# Patient Record
Sex: Male | Born: 1985 | Race: White | Hispanic: No | Smoking: Former smoker
Health system: Southern US, Community
[De-identification: ages and names within clinical notes are randomized; demographics above are authoritative.]

## PROBLEM LIST (undated history)

## (undated) DIAGNOSIS — Z978 Presence of other specified devices: Secondary | ICD-10-CM

## (undated) DIAGNOSIS — K219 Gastro-esophageal reflux disease without esophagitis: Secondary | ICD-10-CM

## (undated) DIAGNOSIS — G8250 Quadriplegia, unspecified: Secondary | ICD-10-CM

## (undated) HISTORY — PX: SUPRAPUBIC CATHETER PLACEMENT: SHX2473

## (undated) HISTORY — PX: OTHER SURGICAL HISTORY: SHX169

---

## 2021-03-18 ENCOUNTER — Other Ambulatory Visit
Admission: RE | Admit: 2021-03-18 | Discharge: 2021-03-18 | Disposition: A | Discharge status: Home / Self Care | Payer: Medicare Other | Source: Ambulatory Visit | Attending: Physician Assistant | Admitting: Physician Assistant

## 2021-03-18 ENCOUNTER — Encounter: Payer: Medicaid Other | Attending: Physician Assistant | Admitting: Physician Assistant

## 2021-03-18 ENCOUNTER — Other Ambulatory Visit: Payer: Self-pay

## 2021-03-18 DIAGNOSIS — L03312 Cellulitis of back [any part except buttock]: Secondary | ICD-10-CM | POA: Diagnosis present

## 2021-03-18 DIAGNOSIS — B999 Unspecified infectious disease: Secondary | ICD-10-CM | POA: Insufficient documentation

## 2021-03-18 NOTE — Progress Notes (Signed)
LEXX, MONTE ([id number]) Visit Report for 03/18/2021 Allergy List Details Patient Name: [name], [name] Date of Service: 03/18/2021 8:45 AM Medical Record Number: [id number] Patient Account Number: [id number] Date of Birth/Sex: Nov 16, 1985 (35 y.o. M) Treating RN: Yevonne Pax Primary Care Athel Merriweather: Unknown Foley Other Clinician: Referring Vannak Montenegro: Unknown Foley Treating Xandria Gallaga/Extender: Allen Derry Weeks in Treatment: 0 Allergies Active Allergies No Known Allergies Allergy Notes Electronic Signature(s) Signed: 03/18/2021 12:20:53 PM By: Yevonne Pax RN Entered By: Yevonne Pax on 03/18/2021 08:59:29 [name] ([id number]) -------------------------------------------------------------------------------- Arrival Information Details Patient Name: [name] Date of Service: 03/18/2021 8:45 AM Medical Record Number: [id number] Patient Account Number: [id number] Date of Birth/Sex: [birth year] (35 y.o. M) Treating RN: Yevonne Pax Primary Care Rye Decoste: Unknown Foley Other Clinician: Referring Kamaal Cast: Unknown Foley Treating Westly Hinnant/Extender: Rowan Blase in Treatment: 0 Visit Information Patient Arrived: Wheel Chair Arrival Time: 08:53 Accompanied By: brother Transfer Assistance: None Patient Identification Verified: Yes Secondary Verification Process Completed: Yes Patient Requires Transmission-Based Precautions: No Patient Has Alerts: No Electronic Signature(s) Signed: 03/18/2021 12:20:53 PM By: Yevonne Pax RN Entered By: Yevonne Pax on 03/18/2021 08:54:12 [name] ([id number]) -------------------------------------------------------------------------------- Clinic Level of Care Assessment Details Patient Name: [name] Date of Service: 03/18/2021 8:45 AM Medical Record Number: [id number] Patient Account Number: [id number] Date of Birth/Sex: June 04, 1985 (35 y.o. M) Treating RN: Yevonne Pax Primary Care Delorus Langwell: Unknown Foley Other  Clinician: Referring Kelis Plasse: Unknown Foley Treating Aanika Defoor/Extender: Rowan Blase in Treatment: 0 Clinic Level of Care Assessment Items TOOL 2 Quantity Score X - Use when only an EandM is performed on the INITIAL visit 1 0 ASSESSMENTS - Nursing Assessment / Reassessment X - General Physical Exam (combine w/ comprehensive assessment (listed just below) when performed on new 1 20 pt. evals) X- 1 25 Comprehensive Assessment (HX, ROS, Risk Assessments, Wounds Hx, etc.) ASSESSMENTS - Wound and Skin Assessment / Reassessment []  - Simple Wound Assessment / Reassessment - one wound 0 X- 2 5 Complex Wound Assessment / Reassessment - multiple wounds []  - 0 Dermatologic / Skin Assessment (not related to wound area) ASSESSMENTS - Ostomy and/or Continence Assessment and Care []  - Incontinence Assessment and Management 0 []  - 0 Ostomy Care Assessment and Management (repouching, etc.) PROCESS - Coordination of Care X - Simple Patient / Family Education for ongoing care 1 15 []  - 0 Complex (extensive) Patient / Family Education for ongoing care []  - 0 Staff obtains , Records, Test Results / Process Orders []  - 0 Staff telephones HHA, Nursing Homes / Clarify orders / etc []  - 0 Routine Transfer to another Facility (non-emergent condition) []  - 0 Routine Hospital Admission (non-emergent condition) X- 1 15 New Admissions / / Ordering NPWT, Apligraf, etc. []  - 0 Emergency Hospital Admission (emergent condition) X- 1 10 Simple Discharge Coordination []  - 0 Complex (extensive) Discharge Coordination PROCESS - Special Needs []  - Pediatric / Minor Patient Management 0 []  - 0 Isolation Patient Management []  - 0 Hearing / Language / Visual special needs []  - 0 Assessment of Community assistance (transportation, D/C planning, etc.) []  - 0 Additional assistance / Altered mentation []  - 0 Support Surface(s) Assessment (bed, cushion, seat,  etc.) INTERVENTIONS - Wound Cleansing / Measurement X - Wound Imaging (photographs - any number of wounds) 1 5 []  - 0 Wound Tracing (instead of photographs) []  - 0 Simple Wound Measurement - one wound X- 2 5 Complex Wound Measurement - multiple wounds Ibrahim, Renn ( ) []  - 0 Simple Wound Cleansing - one wound X- 2 5  Complex Wound Cleansing - multiple wounds INTERVENTIONS - Wound Dressings []  - Small Wound Dressing one or multiple wounds 0 []  - 0 Medium Wound Dressing one or multiple wounds X- 2 20 Large Wound Dressing one or multiple wounds []  - 0 Application of Medications - injection INTERVENTIONS - Miscellaneous []  - External ear exam 0 []  - 0 Specimen Collection (cultures, biopsies, blood, body fluids, etc.) []  - 0 Specimen(s) / Culture(s) sent or taken to Lab for analysis []  - 0 Patient Transfer (multiple staff / Lift / Similar devices) []  - 0 Simple Staple / Suture removal (25 or less) []  - 0 Complex Staple / Suture removal (26 or more) []  - 0 Hypo / Hyperglycemic Management (close monitor of Blood Glucose) []  - 0 Ankle / Brachial Index (ABI) - do not check if billed separately Has the patient been seen at the hospital within the last three years: Yes Total Score: 160 Level Of Care: New/Established - Level 5 Electronic Signature(s) Signed: 03/18/2021 12:20:53 PM By: RN Entered By: on 03/18/2021 09:49:37 ( ) -------------------------------------------------------------------------------- Encounter Discharge Information Details Patient Name: [name] Date of Service: 03/18/2021 8:45 AM Medical Record Number: Patient Account Number: Date of Birth/Sex: Jun 25, 1985 (35 y.o. M) Treating RN: Yevonne Pax Primary Care Okey Zelek: Yevonne Pax Other Clinician: Referring Shaylie Eklund: 14/03/2021 Treating Lichelle Viets/Extender: [name] in Treatment: 0 Encounter Discharge  Information Items Discharge Condition: Stable Ambulatory Status: Wheelchair Discharge Destination: Home Transportation: Private Auto Accompanied By: self Schedule Follow-up Appointment: Yes Clinical Summary of Care: Patient Declined Electronic Signature(s) Signed: 03/18/2021 11:09:31 AM By: Robert Spring RN Entered By: 14/03/2021 on 03/18/2021 11:09:30 [id number] (02/24/1986) -------------------------------------------------------------------------------- Lower Extremity Assessment Details Patient Name: [name] Date of Service: 03/18/2021 8:45 AM Medical Record Number: Unknown Foley Patient Account Number: Unknown Foley Date of Birth/Sex: 01-Apr-1986 (35 y.o. M) Treating RN: Yevonne Pax Primary Care Brindley Madarang: Yevonne Pax Other Clinician: Referring Gabrial Poppell: 14/03/2021 Treating Darcie Mellone/Extender: [name] Weeks in Treatment: 0 Electronic Signature(s) Signed: 03/18/2021 9:14:01 AM By: Robert Spring RN Entered By: 14/03/2021 on 03/18/2021 09:14:01 [id number] (02/24/1986) -------------------------------------------------------------------------------- Multi Wound Chart Details Patient Name: [name] Date of Service: 03/18/2021 8:45 AM Medical Record Number: Unknown Foley Patient Account Number: Unknown Foley Date of Birth/Sex: 1986/01/17 (35 y.o. M) Treating RN: Yevonne Pax Primary Care Naliah Eddington: Yevonne Pax Other Clinician: Referring Kirah Stice: 14/03/2021 Treating Quinnley Colasurdo/Extender: [name] Weeks in Treatment: 0 Vital Signs Height(in): 74 Pulse(bpm): 86 Weight(lbs): 180 Blood Pressure(mmHg): 116/57 Body Mass Index(BMI): 23 Temperature(F): 97.81 Respiratory Rate(breaths/min): 18 Photos: Wound Location: Left Back Left Back Right Back Wounding Event: Gradually Appeared Gradually Appeared Gradually Appeared Primary Etiology: Atypical Atypical Atypical Date Acquired: 12/07/2017 12/07/2017 12/06/2017 Weeks of Treatment: 0 0 0 Wound Status: Open Open  Open Clustered Wound: Yes Yes No Measurements L x W x D (cm) 50x17x0.2 50x17x0.2 23x14x0.2 Area (cm) : 0011001100 667.588 252.898 Volume (cm) : 133.518 133.518 50.58 % Reduction in Area: 0.00% 0.00% 0.00% % Reduction in Volume: 0.00% 0.00% 0.00% Classification: Full Thickness Without Exposed Full Thickness Without Exposed Full Thickness Without Exposed Support Structures Support Structures Support Structures Exudate Amount: Medium Medium Medium Exudate Type: Serosanguineous Serosanguineous Serosanguineous Exudate Color: red, brown red, brown red, brown Granulation Amount: Medium (34-66%) Medium (34-66%) Medium (34-66%) Granulation Quality: Red, Pink Red, Pink Red, Pink Necrotic Amount: Medium (34-66%) Medium (34-66%) Medium (34-66%) Exposed Structures: Fat Layer (Subcutaneous Tissue): Fat Layer (Subcutaneous Tissue): Fat Layer (Subcutaneous Tissue): Yes Yes Yes Fascia: No Tendon: No Muscle: No  Joint: No Bone: No Epithelialization: None None Large (67-100%) Treatment Notes Electronic Signature(s) Signed: 03/18/2021 9:47:42 AM By: Yevonne Pax RN Entered By: Yevonne Pax on 03/18/2021 09:47:42 [name] ([id number]) -------------------------------------------------------------------------------- Multi-Disciplinary Care Plan Details Patient Name: [name] Date of Service: 03/18/2021 8:45 AM Medical Record Number: [id number] Patient Account Number: [id number] Date of Birth/Sex: 1985-04-26 (35 y.o. M) Treating RN: Yevonne Pax Primary Care Londell Noll: Unknown Foley Other Clinician: Referring Any Mcneice: Unknown Foley Treating Tyrek Lawhorn/Extender: Allen Derry Weeks in Treatment: 0 Active Inactive Wound/Skin Impairment Nursing Diagnoses: Knowledge deficit related to ulceration/compromised skin integrity Goals: Patient/caregiver will verbalize understanding of skin care regimen Date Initiated: 03/18/2021 Target Resolution Date: 04/18/2021 Goal Status: Active Ulcer/skin  breakdown will have a volume reduction of 30% by week 4 Date Initiated: 03/18/2021 Target Resolution Date: 04/18/2021 Goal Status: Active Ulcer/skin breakdown will have a volume reduction of 50% by week 8 Date Initiated: 03/18/2021 Target Resolution Date: 05/19/2021 Goal Status: Active Ulcer/skin breakdown will have a volume reduction of 80% by week 12 Date Initiated: 03/18/2021 Target Resolution Date: 06/16/2021 Goal Status: Active Ulcer/skin breakdown will heal within 14 weeks Date Initiated: 03/18/2021 Target Resolution Date: 07/17/2021 Goal Status: Active Interventions: Assess patient/caregiver ability to obtain necessary supplies Assess patient/caregiver ability to perform ulcer/skin care regimen upon admission and as needed Assess ulceration(s) every visit Notes: Electronic Signature(s) Signed: 03/18/2021 9:22:56 AM By: Yevonne Pax RN Entered By: Yevonne Pax on 03/18/2021 09:22:56 [name] ([id number]) -------------------------------------------------------------------------------- Pain Assessment Details Patient Name: [name] Date of Service: 03/18/2021 8:45 AM Medical Record Number: [id number] Patient Account Number: [id number] Date of Birth/Sex: 1985-08-28 (35 y.o. M) Treating RN: Yevonne Pax Primary Care  Wenzlick: Unknown Foley Other Clinician: Referring Kristalyn Bergstresser: Unknown Foley Treating Lilac Hoff/Extender: Rowan Blase in Treatment: 0 Active Problems Location of Pain Severity and Description of Pain Patient Has Paino Yes Site Locations With Dressing Change: Yes Duration of the Pain. Constant / Intermittento Intermittent How Long Does it Lasto Hours: Minutes: 15 Rate the pain. Current Pain Level: 3 Worst Pain Level: 10 Least Pain Level: 0 Tolerable Pain Level: 5 Character of Pain Describe the Pain: Burning Pain Management and Medication Current Pain Management: Medication: No Cold Application: No Rest: Yes Massage: No Activity:  No T.E.N.S.: No Heat Application: No Leg drop or elevation: No Is the Current Pain Management Adequate: Inadequate How does your wound impact your activities of daily livingo Sleep: No Bathing: Yes Appetite: Yes Relationship With Others: No Bladder Continence: No Emotions: No Bowel Continence: No Work: No Toileting: No Drive: No Dressing: No Hobbies: No Electronic Signature(s) Signed: 03/18/2021 12:20:53 PM By: Yevonne Pax RN Entered By: Yevonne Pax on 03/18/2021 08:57:51 [name] ([id number]) -------------------------------------------------------------------------------- Patient/Caregiver Education Details Patient Name: [name] Date of Service: 03/18/2021 8:45 AM Medical Record Number: [id number] Patient Account Number: [id number] Date of Birth/Gender: [birth year] (35 y.o. M) Treating RN: Yevonne Pax Primary Care Physician: Unknown Foley Other Clinician: Referring Physician: Unknown Foley Treating Physician/Extender: Rowan Blase in Treatment: 0 Education Assessment Education Provided To: Patient Education Topics Provided Wound/Skin Impairment: Methods: Explain/Verbal Responses: State content correctly Electronic Signature(s) Signed: 03/18/2021 12:20:53 PM By: Yevonne Pax RN Entered By: Yevonne Pax on 03/18/2021 09:51:25 [name] ([id number]) -------------------------------------------------------------------------------- Wound Assessment Details Patient Name: [name] Date of Service: 03/18/2021 8:45 AM Medical Record Number: [id number] Patient Account Number: [id number] Date of Birth/Sex: Aug 11, 1985 (35 y.o. M) Treating RN: Yevonne Pax Primary Care Jeanett Antonopoulos: Unknown Foley Other Clinician: Referring Thorvald Orsino: Unknown Foley Treating Arlone Lenhardt/Extender: Allen Derry Weeks in Treatment: 0 Wound Status Wound Number: 1  Primary Etiology: Atypical Wound Location: Left Back Wound Status: Open Wounding Event: Gradually Appeared Date  Acquired: 12/07/2017 Weeks Of Treatment: 0 Clustered Wound: Yes Photos Wound Measurements Length: (cm) 50 Width: (cm) 17 Depth: (cm) 0.2 Area: (cm) 667.588 Volume: (cm) 133.518 % Reduction in Area: 0% % Reduction in Volume: 0% Epithelialization: None Tunneling: No Undermining: No Wound Description Classification: Full Thickness Without Exposed Support Structu Exudate Amount: Medium Exudate Type: Serosanguineous Exudate Color: red, brown res Foul Odor After Cleansing: No Slough/Fibrino Yes Wound Bed Granulation Amount: Medium (34-66%) Exposed Structure Granulation Quality: Red, Pink Fat Layer (Subcutaneous Tissue) Exposed: Yes Necrotic Amount: Medium (34-66%) Electronic Signature(s) Signed: 03/18/2021 12:20:53 PM By: Yevonne Pax RN Entered By: Yevonne Pax on 03/18/2021 09:27:01 [name] ([id number]) -------------------------------------------------------------------------------- Wound Assessment Details Patient Name: [name] Date of Service: 03/18/2021 8:45 AM Medical Record Number: [id number] Patient Account Number: [id number] Date of Birth/Sex: May 20, 1985 (35 y.o. M) Treating RN: Yevonne Pax Primary Care Leiya Keesey: Unknown Foley Other Clinician: Referring Raguel Kosloski: Unknown Foley Treating Katryna Tschirhart/Extender: Allen Derry Weeks in Treatment: 0 Wound Status Wound Number: 1 Primary Etiology: Atypical Wound Location: Left Back Wound Status: Open Wounding Event: Gradually Appeared Date Acquired: 12/07/2017 Weeks Of Treatment: 0 Clustered Wound: Yes Photos Wound Measurements Length: (cm) 50 Width: (cm) 17 Depth: (cm) 0.2 Area: (cm) 667.588 Volume: (cm) 133.518 % Reduction in Area: 0% % Reduction in Volume: 0% Epithelialization: None Tunneling: No Undermining: No Wound Description Classification: Full Thickness Without Exposed Support Structu Exudate Amount: Medium Exudate Type: Serosanguineous Exudate Color: red, brown res Foul Odor After  Cleansing: No Slough/Fibrino Yes Wound Bed Granulation Amount: Medium (34-66%) Exposed Structure Granulation Quality: Red, Pink Fat Layer (Subcutaneous Tissue) Exposed: Yes Necrotic Amount: Medium (34-66%) Treatment Notes Wound #1 (Back) Wound Laterality: Left Cleanser Peri-Wound Care Topical Triamcinolone Acetonide Cream, 0.1%, 15 (g) tube Discharge Instruction: thin layer fungal spray Discharge Instruction: OTC Athletes foot spray or power NAVI, ERBER ([id number]) Primary Dressing Secondary Dressing Secured With Compression Wrap Compression Stockings Add-Ons Electronic Signature(s) Signed: 03/18/2021 9:47:16 AM By: Yevonne Pax RN Entered By: Yevonne Pax on 03/18/2021 09:47:16 [name] ([id number]) -------------------------------------------------------------------------------- Wound Assessment Details Patient Name: [name] Date of Service: 03/18/2021 8:45 AM Medical Record Number: [id number] Patient Account Number: [id number] Date of Birth/Sex: [birth year] (35 y.o. M) Treating RN: Yevonne Pax Primary Care Brittanie Dosanjh: Unknown Foley Other Clinician: Referring Erman Thum: Unknown Foley Treating Cornelious Bartolucci/Extender: Allen Derry Weeks in Treatment: 0 Wound Status Wound Number: 2 Primary Etiology: Atypical Wound Location: Right Back Wound Status: Open Wounding Event: Gradually Appeared Date Acquired: 12/06/2017 Weeks Of Treatment: 0 Clustered Wound: No Photos Wound Measurements Length: (cm) 23 Width: (cm) 14 Depth: (cm) 0.2 Area: (cm) 252.898 Volume: (cm) 50.58 % Reduction in Area: 0% % Reduction in Volume: 0% Epithelialization: Large (67-100%) Tunneling: No Undermining: No Wound Description Classification: Full Thickness Without Exposed Support Structu Exudate Amount: Medium Exudate Type: Serosanguineous Exudate Color: red, brown res Foul Odor After Cleansing: No Slough/Fibrino Yes Wound Bed Granulation Amount: Medium (34-66%) Exposed  Structure Granulation Quality: Red, Pink Fascia Exposed: No Necrotic Amount: Medium (34-66%) Fat Layer (Subcutaneous Tissue) Exposed: Yes Necrotic Quality: Adherent Slough Tendon Exposed: No Muscle Exposed: No Joint Exposed: No Bone Exposed: No Treatment Notes Wound #2 (Back) Wound Laterality: Right Cleanser Peri-Wound Care Topical Triamcinolone Acetonide Cream, 0.1%, 15 (g) tube [name], [name] ([id number]) Discharge Instruction: thin layer fungal spray Discharge Instruction: OTC Athletes foot spray or power Primary Dressing Secondary Dressing Secured With Compression Wrap Compression Stockings Add-Ons Electronic Signature(s) Signed: 03/18/2021 12:20:53 PM By: Jettie Pagan,  Lyla Son RN Entered By: Yevonne Pax on 03/18/2021 09:12:45 [name] ([id number]) -------------------------------------------------------------------------------- Vitals Details Patient Name: [name] Date of Service: 03/18/2021 8:45 AM Medical Record Number: [id number] Patient Account Number: [id number] Date of Birth/Sex: January 24, 1986 (35 y.o. M) Treating RN: Yevonne Pax Primary Care Crosby Oriordan: Unknown Foley Other Clinician: Referring Deema Juncaj: Unknown Foley Treating Gedalia Mcmillon/Extender: Allen Derry Weeks in Treatment: 0 Vital Signs Time Taken: 08:55 Temperature (F): 97.81 Height (in): 74 Pulse (bpm): 86 Source: Stated Respiratory Rate (breaths/min): 18 Weight (lbs): 180 Blood Pressure (mmHg): 116/57 Source: Stated Reference Range: 80 - 120 mg / dl Body Mass Index (BMI): 23.1 Electronic Signature(s) Signed: 03/18/2021 12:20:53 PM By: Yevonne Pax RN Entered By: Yevonne Pax on 03/18/2021 08:59:09

## 2021-03-18 NOTE — Progress Notes (Signed)
LAKE, CINQUEMANI ([id number]) Visit Report for 03/18/2021 Abuse/Suicide Risk Screen Details Patient Name: [name], [name] Date of Service: 03/18/2021 8:45 AM Medical Record Number: [id number] Patient Account Number: [id number] Date of Birth/Sex: 1985/04/25 (35 y.o. M) Treating RN: Yevonne Pax Primary Care Yashvi Jasinski: Unknown Foley Other Clinician: Referring Lilymae Swiech: Unknown Foley Treating Isabella Ida/Extender: Allen Derry Weeks in Treatment: 0 Abuse/Suicide Risk Screen Items Answer ABUSE RISK SCREEN: Has anyone close to you tried to hurt or harm you recentlyo No Do you feel uncomfortable with anyone in your familyo No Has anyone forced you do things that you didnot want to doo No Electronic Signature(s) Signed: 03/18/2021 12:20:53 PM By: Yevonne Pax RN Entered By: Yevonne Pax on 03/18/2021 09:01:15 [name] ([id number]) -------------------------------------------------------------------------------- Activities of Daily Living Details Patient Name: [name] Date of Service: 03/18/2021 8:45 AM Medical Record Number: [id number] Patient Account Number: [id number] Date of Birth/Sex: [birth year] (35 y.o. M) Treating RN: Yevonne Pax Primary Care Myrtice Lowdermilk: Unknown Foley Other Clinician: Referring Mehak Roskelley: Unknown Foley Treating Breyer Tejera/Extender: Rowan Blase in Treatment: 0 Activities of Daily Living Items Answer Activities of Daily Living (Please select one for each item) Drive Automobile Not Able Take Medications Need Assistance Use Telephone Completely Able Care for Appearance Need Assistance Use Toilet Need Assistance Bath / Shower Need Assistance Dress Self Need Assistance Feed Self Completely Able Walk Not Able Get In / Out Bed Need Assistance Housework Not Able Prepare Meals Not Able Handle Money Not Able Shop for Self Need Assistance Electronic Signature(s) Signed: 03/18/2021 12:20:53 PM By: Yevonne Pax RN Entered By: Yevonne Pax on 03/18/2021  09:03:09 [name] ([id number]) -------------------------------------------------------------------------------- Education Screening Details Patient Name: [name] Date of Service: 03/18/2021 8:45 AM Medical Record Number: [id number] Patient Account Number: [id number] Date of Birth/Sex: February 15, 1986 (35 y.o. M) Treating RN: Yevonne Pax Primary Care Sheneika Walstad: Unknown Foley Other Clinician: Referring Machell Wirthlin: Unknown Foley Treating Ajanae Virag/Extender: Rowan Blase in Treatment: 0 Learning Preferences/Education Level/Primary Language Learning Preference: Explanation Highest Education Level: High School Preferred Language: English Cognitive Barrier Language Barrier: No Translator Needed: No Memory Deficit: No Emotional Barrier: No Cultural/Religious Beliefs Affecting Medical Care: No Physical Barrier Impaired Vision: Yes Glasses Impaired Hearing: No Decreased Hand dexterity: No Knowledge/Comprehension Knowledge Level: Medium Comprehension Level: High Ability to understand written instructions: High Ability to understand verbal instructions: High Motivation Anxiety Level: Anxious Cooperation: Cooperative Education Importance: Acknowledges Need Interest in Health Problems: Asks Questions Perception: Coherent Willingness to Engage in Self-Management High Activities: Readiness to Engage in Self-Management High Activities: Electronic Signature(s) Signed: 03/18/2021 12:20:53 PM By: Yevonne Pax RN Entered By: Yevonne Pax on 03/18/2021 09:03:33 [name] ([id number]) -------------------------------------------------------------------------------- Fall Risk Assessment Details Patient Name: [name] Date of Service: 03/18/2021 8:45 AM Medical Record Number: [id number] Patient Account Number: [id number] Date of Birth/Sex: [birth year] (35 y.o. M) Treating RN: Yevonne Pax Primary Care Davyd Podgorski: Unknown Foley Other Clinician: Referring Tao Satz: Unknown Foley Treating Etienne Millward/Extender: Rowan Blase in Treatment: 0 Fall Risk Assessment Items Have you had 2 or more falls in the last 12 monthso 0 No Have you had any fall that resulted in injury in the last 12 monthso 0 No FALLS RISK SCREEN History of falling - immediate or within 3 months 0 No Secondary diagnosis (Do you have 2 or more medical diagnoseso) 0 No Ambulatory aid None/bed rest/wheelchair/nurse 0 No Crutches/cane/walker 0 No Furniture 0 No Intravenous therapy Access/Saline/Heparin Lock 0 No Gait/Transferring Normal/ bed rest/ wheelchair 0 No Weak (short steps with or without shuffle, stooped but able to lift head while walking, may  0 No seek support from furniture) Impaired (short steps with shuffle, may have difficulty arising from chair, head down, impaired 0 No balance) Mental Status Oriented to own ability 0 No Electronic Signature(s) Signed: 03/18/2021 12:20:53 PM By: Yevonne Pax RN Entered By: Yevonne Pax on 03/18/2021 09:03:38 [name] ([id number]) -------------------------------------------------------------------------------- Foot Assessment Details Patient Name: [name] Date of Service: 03/18/2021 8:45 AM Medical Record Number: [id number] Patient Account Number: [id number] Date of Birth/Sex: 11/02/1985 (35 y.o. M) Treating RN: Yevonne Pax Primary Care Lilleigh Hechavarria: Unknown Foley Other Clinician: Referring Thong Feeny: Unknown Foley Treating Bryssa Tones/Extender: Allen Derry Weeks in Treatment: 0 Foot Assessment Items Site Locations + = Sensation present, - = Sensation absent, C = Callus, U = Ulcer R = Redness, W = Warmth, M = Maceration, PU = Pre-ulcerative lesion F = Fissure, S = Swelling, D = Dryness Assessment Right: Left: Other Deformity: No No Prior Foot Ulcer: No No Prior Amputation: No No Charcot Joint: No No Ambulatory Status: Non-ambulatory Assistance Device: Wheelchair Gait: Surveyor, mining) Signed:  03/18/2021 12:20:53 PM By: Yevonne Pax RN Entered By: Yevonne Pax on 03/18/2021 09:04:05 [name] ([id number]) -------------------------------------------------------------------------------- Nutrition Risk Screening Details Patient Name: [name] Date of Service: 03/18/2021 8:45 AM Medical Record Number: [id number] Patient Account Number: [id number] Date of Birth/Sex: April 15, 1985 (35 y.o. M) Treating RN: Yevonne Pax Primary Care Jemia Fata: Unknown Foley Other Clinician: Referring Gentry Seeber: Unknown Foley Treating Dareld Mcauliffe/Extender: Allen Derry Weeks in Treatment: 0 Height (in): 74 Weight (lbs): 180 Body Mass Index (BMI): 23.1 Nutrition Risk Screening Items Score Screening NUTRITION RISK SCREEN: I have an illness or condition that made me change the kind and/or amount of food I eat 0 No I eat fewer than two meals per day 0 No I eat few fruits and vegetables, or milk products 0 No I have three or more drinks of beer, liquor or wine almost every day 0 No I have tooth or mouth problems that make it hard for me to eat 0 No I don't always have enough money to buy the food I need 0 No I eat alone most of the time 0 No I take three or more different prescribed or over-the-counter drugs a day 1 Yes Without wanting to, I have lost or gained 10 pounds in the last six months 0 No I am not always physically able to shop, cook and/or feed myself 2 Yes Nutrition Protocols Good Risk Protocol Moderate Risk Protocol 0 Provide education on nutrition High Risk Proctocol Risk Level: Moderate Risk Score: 3 Electronic Signature(s) Signed: 03/18/2021 12:20:53 PM By: Yevonne Pax RN Entered By: Yevonne Pax on 03/18/2021 09:03:50

## 2021-03-20 NOTE — Progress Notes (Signed)
[name] ([id number]) Visit Report for 03/18/2021 Chief Complaint Document Details Patient Name: [name], [name] Date of Service: 03/18/2021 8:45 AM Medical Record Number: [id number] Patient Account Number: [id number] Date of Birth/Sex: 07-11-1985 (35 y.o. M) Treating RN: Yevonne Pax Primary Care Provider: Unknown Foley Other Clinician: Referring Provider: Unknown Foley Treating Provider/Extender: Allen Derry Weeks in Treatment: 0 Information Obtained from: Patient Chief Complaint Back Ulcers Electronic Signature(s) Signed: 03/18/2021 9:36:20 AM By: Lenda Kelp PA-C Entered By: Lenda Kelp on 03/18/2021 09:36:20 [name] ([id number]) -------------------------------------------------------------------------------- HPI Details Patient Name: [name] Date of Service: 03/18/2021 8:45 AM Medical Record Number: [id number] Patient Account Number: [id number] Date of Birth/Sex: [birth year] (35 y.o. M) Treating RN: Yevonne Pax Primary Care Provider: Unknown Foley Other Clinician: Referring Provider: Unknown Foley Treating Provider/Extender: Allen Derry Weeks in Treatment: 0 History of Present Illness HPI Description: 03/18/2021 upon evaluation today patient presents for initial inspection here in the clinic concerning issues that he has been having with his back. Fortunately this seems to be related to multiple potential issues here which involve moisture, pressure, and I believe a fungal infection as well. In the past he seems to have responded to steroids topically. With that being said I am thinking that might still be something that could be helpful for him. I am going to see about setting this and to the pharmacy to get things started. I also think we may want to do a wound culture in order to evaluate for any possibilities there. Electronic Signature(s) Signed: 03/18/2021 9:45:42 AM By: Lenda Kelp PA-C Entered By: Lenda Kelp on 03/18/2021  09:45:42 [name] ([id number]) -------------------------------------------------------------------------------- Physical Exam Details Patient Name: [name] Date of Service: 03/18/2021 8:45 AM Medical Record Number: [id number] Patient Account Number: [id number] Date of Birth/Sex: [birth year] (35 y.o. M) Treating RN: Yevonne Pax Primary Care Provider: Unknown Foley Other Clinician: Referring Provider: Unknown Foley Treating Provider/Extender: Allen Derry Weeks in Treatment: 0 Constitutional sitting or standing blood pressure is within target range for patient.. pulse regular and within target range for patient.Marland Kitchen respirations regular, non- labored and within target range for patient.Marland Kitchen temperature within target range for patient.. Well-nourished and well-hydrated in no acute distress. Eyes conjunctiva clear no eyelid edema noted. pupils equal round and reactive to light and accommodation. Ears, Nose, Mouth, and Throat no gross abnormality of ear auricles or external auditory canals. normal hearing noted during conversation. mucus membranes moist. Respiratory normal breathing without difficulty. Psychiatric this patient is able to make decisions and demonstrates good insight into disease process. Alert and Oriented x 3. pleasant and cooperative. Notes Upon inspection patient's wound bed actually showed signs of several open areas over his back extending from down at the sacral area all the way up to his shoulders. Again I think a lot of this is probably moisture related to a tremendous degree. Also think a fungal component and even potential for bacterial component is there. Nonetheless before just put him on antibiotics I would like to actually see how things do and see what the culture shows. Electronic Signature(s) Signed: 03/18/2021 9:46:13 AM By: Lenda Kelp PA-C Entered By: Lenda Kelp on 03/18/2021 09:46:12 [name]  ([id number]) -------------------------------------------------------------------------------- Physician Orders Details Patient Name: [name] Date of Service: 03/18/2021 8:45 AM Medical Record Number: [id number] Patient Account Number: [id number] Date of Birth/Sex: [birth year] (35 y.o. 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, incomplete Bathing/ Shower/ Hygiene o May shower; gently cleanse wound with antibacterial soap, rinse and pat dry prior to dressing wounds Wound Treatment Wound #1 - Back Wound Laterality: Left Topical: Triamcinolone Acetonide Cream, 0.1%, 15 (g) tube 1 x Per Day/30 Days Discharge Instructions: thin layer Topical: fungal spray 1 x Per Day/30 Days Discharge Instructions: OTC Athletes foot spray or power Wound #2 - Back Wound Laterality: Right Topical: Triamcinolone Acetonide Cream, 0.1%, 15 (g) tube Discharge Instructions: thin layer Topical: fungal spray Discharge Instructions: OTC Athletes foot spray or power Patient Medications Allergies: No Known Allergies Notifications Medication Indication Start End triamcinolone acetonide 03/18/2021 DOSE topical 0.1 % cream - cream topical applied in a thin film to the patient's back daily x 30 days Electronic Signature(s) Signed: 03/18/2021 11:08:38 AM By: Yevonne Pax RN Signed: 03/19/2021 5:37:18 PM By: Lenda Kelp PA-C Previous Signature: 03/18/2021 9:47:56 AM Version By: Lenda Kelp PA-C Entered By: Yevonne Pax on 03/18/2021 11:08:37 [name]  ([id number]) -------------------------------------------------------------------------------- Problem List Details Patient Name: [name] Date of Service: 03/18/2021 8:45 AM Medical Record Number: [id number] Patient Account Number: [id number] Date of Birth/Sex: April 04, 1986 (35 y.o. 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 M) Treating RN: Yevonne Pax Primary Care Provider: Unknown Foley Other Clinician: Referring Provider: Unknown Foley Treating Provider/Extender: Rowan Blase in Treatment: 0 Subjective Chief Complaint Information obtained from Patient Back Ulcers History of Present Illness (HPI) 03/18/2021 upon evaluation today patient presents for initial inspection here in the clinic concerning issues that he has been having with his back. Fortunately this seems to be related to multiple potential issues here which involve moisture, pressure, and I believe a fungal  infection as well. In the past he seems to have responded to steroids topically. With that being said I am thinking that might still be something that could be helpful for him. I am going to see about setting this and to the pharmacy to get things started. I also think we may want to do a wound culture in order to evaluate for any possibilities there. Patient History Information obtained from Patient. Allergies No Known Allergies Social History Former smoker, Marital Status - Divorced, Alcohol Use - Never, Drug Use - Current History - CBD, Caffeine Use - Daily. Review of Systems (ROS) Constitutional Symptoms (General Health) Denies complaints or symptoms of Fatigue, Fever, Chills, Marked Weight Change. Integumentary (Skin) Complains or has symptoms of Wounds. Objective Constitutional sitting or standing blood pressure is within target range for patient.. pulse regular and within target range for patient.Marland Kitchen respirations regular, non- labored and within target  range for patient.Marland Kitchen temperature within target range for patient.. Well-nourished and well-hydrated in no acute distress. Vitals Time Taken: 8:55 AM, Height: 74 in, Source: Stated, Weight: 180 lbs, Source: Stated, BMI: 23.1, Temperature: 97.81 F, Pulse: 86 bpm, Respiratory Rate: 18 breaths/min, Blood Pressure: 116/57 mmHg. Eyes conjunctiva clear no eyelid edema noted. pupils equal round and reactive to light and accommodation. Ears, Nose, Mouth, and Throat no gross abnormality of ear auricles or external auditory canals. normal hearing noted during conversation. mucus membranes moist. Respiratory normal breathing without difficulty. Psychiatric this patient is able to make decisions and demonstrates good insight into disease process. Alert and Oriented x 3. pleasant and cooperative. General Notes: Upon inspection patient's wound bed actually showed signs of several open areas over his back extending from down at the sacral area all  the way up to his shoulders. Again I think a lot of this is probably moisture related to a tremendous degree. Also think a fungal component and even potential for bacterial component is there. Nonetheless before just put him on antibiotics I would like to actually see how things do and see what the culture shows. [name], [name] ([id number]) Integumentary (Hair, Skin) Wound #1 status is Open. Original cause of wound was Gradually Appeared. The date acquired was: 12/07/2017. The wound is located on the Left Back. The wound measures 50cm length x 17cm width x 0.2cm depth; 667.588cm^2 area and 133.518cm^3 volume. There is Fat Layer (Subcutaneous Tissue) exposed. There is no tunneling or undermining noted. There is a medium amount of serosanguineous drainage noted. There is medium (34-66%) red, pink granulation within the wound bed. There is a medium (34-66%) amount of necrotic tissue within the wound bed. Wound #1 status is Open. Original cause of wound was Gradually Appeared. The date acquired was: 12/07/2017. The wound is located on the Left Back. The wound measures 50cm length x 17cm width x 0.2cm depth; 667.588cm^2 area and 133.518cm^3 volume. There is Fat Layer (Subcutaneous Tissue) exposed. There is no tunneling or undermining noted. There is a medium amount of serosanguineous drainage noted. There is medium (34-66%) red, pink granulation within the wound bed. There is a medium (34-66%) amount of necrotic tissue within the wound bed. Wound #2 status is Open. Original cause of wound was Gradually Appeared. The date acquired was: 12/06/2017. The wound is located on the Right Back. The wound measures 23cm length x 14cm width x 0.2cm depth; 252.898cm^2 area and 50.58cm^3 volume. There is Fat Layer (Subcutaneous Tissue) exposed. There is no tunneling or undermining noted. There is a medium amount of serosanguineous drainage noted. There is medium (34-66%) red, pink granulation within the wound bed. There is a  medium (34-66%) amount of necrotic tissue within the wound bed including Adherent Slough. 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, incomplete Plan Follow-up Appointments: Bathing/ Shower/ Hygiene: May shower; gently cleanse wound with antibacterial soap, rinse and pat dry prior to dressing wounds The following medication(s) was prescribed: triamcinolone acetonide topical 0.1 % cream cream topical applied in a thin film to the patient's back daily x 30 days starting 03/18/2021 WOUND #1: - Back Wound Laterality: Left Topical: Triamcinolone Acetonide Cream, 0.1%, 15 (g) tube Discharge Instructions: thin layer Topical: fungal spray Discharge Instructions: OTC Athletes foot spray or power WOUND #2: - Back Wound Laterality: Right Topical: Triamcinolone Acetonide Cream, 0.1%, 15 (g) tube Discharge Instructions: thin layer Topical: fungal spray Discharge Instructions:  OTC Athletes foot spray or power 1. Would recommend currently that we going to continue with the wound care measures as before and the patient is in agreement the plan. This includes the use of the triamcinolone cream to the back which I think is good to be beneficial currently. 2. I am also can recommend that the patient get some over-the-counter antifungal athlete's foot powder and sprinkle over top of the triamcinolone after applied to the back in order to help with the potential for fungal infection. 3. I am also can recommend that the patient should be offloading. I think there is a pressure as well as moisture component he spends a lot of time on his back right now. I think that he needs to offload and try to keep pressure off is much as possible this will also help with some of the moisture issues he can lay on his left side but not his right. We will see patient back for  reevaluation in 2 weeks here in the clinic. If anything worsens or changes patient will contact our office for additional recommendations. Electronic Signature(s) Signed: 03/18/2021 9:48:59 AM By: Lenda Kelp PA-C Entered By: Lenda Kelp on 03/18/2021 09:48:59 [name], [name] ([id number]) [name], [name] ([id number]) -------------------------------------------------------------------------------- ROS/PFSH Details Patient Name: [name] Date of Service: 03/18/2021 8:45 AM Medical Record Number: [id number] Patient Account Number: [id number] Date of Birth/Sex: [birth year] (35 y.o. M) Treating RN: Yevonne Pax Primary Care Provider: Unknown Foley Other Clinician: Referring Provider: Unknown Foley Treating Provider/Extender: Rowan Blase in Treatment: 0 Information Obtained From Patient Constitutional Symptoms (General Health) Complaints and Symptoms: Negative for: Fatigue; Fever; Chills; Marked Weight Change Integumentary (Skin) Complaints and Symptoms: Positive for: Wounds Immunizations Pneumococcal Vaccine: Received Pneumococcal Vaccination: No Implantable Devices None Family and Social History Former smoker; Marital Status - Divorced; Alcohol Use: Never; Drug Use: Current History - CBD; Caffeine Use: Daily; Financial Concerns: No; Food, Clothing or Shelter Needs: No; Support System Lacking: No; Transportation Concerns: No Electronic Signature(s) Signed: 03/18/2021 9:57:25 AM By: Lenda Kelp PA-C Signed: 03/18/2021 12:20:53 PM By: Yevonne Pax RN Entered By: Yevonne Pax on 03/18/2021 09:01:08 [name] ([id number]) -------------------------------------------------------------------------------- SuperBill Details Patient Name: [name] Date of Service: 03/18/2021 Medical Record Number: [id number] Patient Account Number: [id number] Date of Birth/Sex: [birth year] (35 y.o. 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, incomplete Facility Procedures CPT4 Code: 24401027 Description: 99204 - WOUND CARE VISIT-LEV 4 NEW PT Modifier: Quantity: 1 Physician Procedures CPT4 Code: 2536644 Description: 99204 - WC PHYS LEVEL 4 - NEW PT Modifier: Quantity: 1 CPT4 Code: Description: ICD-10 Diagnosis Description L98.8 Other specified disorders of the skin and subcutaneous tissue B35.4 Tinea corporis 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

## 2021-03-21 LAB — AEROBIC CULTURE W GRAM STAIN (SUPERFICIAL SPECIMEN)

## 2021-04-05 ENCOUNTER — Ambulatory Visit: Payer: Medicaid Other | Admitting: Internal Medicine

## 2021-04-19 ENCOUNTER — Encounter: Payer: Medicare Other | Attending: Physician Assistant | Admitting: Physician Assistant

## 2021-04-19 ENCOUNTER — Other Ambulatory Visit: Payer: Self-pay

## 2021-04-19 DIAGNOSIS — G8222 Paraplegia, incomplete: Secondary | ICD-10-CM | POA: Diagnosis not present

## 2021-04-19 DIAGNOSIS — B354 Tinea corporis: Secondary | ICD-10-CM | POA: Insufficient documentation

## 2021-04-19 DIAGNOSIS — L98422 Non-pressure chronic ulcer of back with fat layer exposed: Secondary | ICD-10-CM | POA: Diagnosis not present

## 2021-04-19 NOTE — Progress Notes (Addendum)
[name], Donavin ([id number]) Visit Report for 04/19/2021 Chief Complaint Document Details Patient Name: [name], [name] Date of Service: 04/19/2021 11:30 AM Medical Record Number: [id number] Patient Account Number: [id number] Date of Birth/Sex: Jun 09, 1985 (36 y.o. M) Treating RN: Hansel FeinsteinBishop, Joy Primary Care Provider: Unknown FoleyWhitten, Robin Other Clinician: Referring Provider: Unknown FoleyWhitten, Robin Treating Provider/Extender: Allen DerryStone, Aleatha Taite Weeks in Treatment: 4 Information Obtained from: Patient Chief Complaint Back Ulcers Electronic Signature(s) Signed: 04/19/2021 11:49:24 AM By: Lenda KelpStone III, Fatou Dunnigan PA-C Entered By: Lenda KelpStone III, Shadow Stiggers on 04/19/2021 11:49:24 [name], [name] ([id number]) -------------------------------------------------------------------------------- HPI Details Patient Name: [name], [name] Date of Service: 04/19/2021 11:30 AM Medical Record Number: [id number] Patient Account Number: [id number] Date of Birth/Sex: Jun 09, 1985 (35 y.o. M) Treating RN: Hansel FeinsteinBishop, Joy Primary Care Provider: Unknown FoleyWhitten, Robin Other Clinician: Referring Provider: Unknown FoleyWhitten, Robin Treating Provider/Extender: Rowan BlaseStone, Naiya Corral Weeks in Treatment: 4 History of Present Illness HPI Description: 03/18/2021 upon evaluation today patient presents for initial inspection here in the clinic concerning issues that he has been having with his back. Fortunately this seems to be related to multiple potential issues here which involve moisture, pressure, and I believe a fungal infection as well. In the past he seems to have responded to steroids topically. With that being said I am thinking that might still be something that could be helpful for him. I am going to see about setting this and to the pharmacy to get things started. I also think we may want to do a wound culture in order to evaluate for any possibilities there. 04/19/2021 upon evaluation today patient actually appears to be doing excellent in regard to his back. He is actually making wonderful  progress as far as this is concerned. I do not see any signs of infection at this time which is great news and overall I think that he is doing quite well. Electronic Signature(s) Signed: 04/19/2021 12:53:45 PM By: Lenda KelpStone III, Nekeshia Lenhardt PA-C Entered By: Lenda KelpStone III, Jamielynn Wigley on 04/19/2021 12:53:45 [name], Tracie ([id number]) -------------------------------------------------------------------------------- Physical Exam Details Patient Name: [name], [name] Date of Service: 04/19/2021 11:30 AM Medical Record Number: [id number] Patient Account Number: [id number] Date of Birth/Sex: Jun 09, 1985 (35 y.o. M) Treating RN: Hansel FeinsteinBishop, Joy Primary Care Provider: Unknown FoleyWhitten, Robin Other Clinician: Referring Provider: Unknown FoleyWhitten, Robin Treating Provider/Extender: Allen DerryStone, Divit Stipp Weeks in Treatment: 4 Constitutional Well-nourished and well-hydrated in no acute distress. Respiratory normal breathing without difficulty. Psychiatric this patient is able to make decisions and demonstrates good insight into disease process. Alert and Oriented x 3. pleasant and cooperative. Notes Patient's wound bed actually showed signs of good granulation and epithelization at this point in fact there is hardly anything left open at this time and I think that the triamcinolone has been of great benefit for him. Overall I think that we are headed in the right direction he is using Goldbond over top of the triamcinolone once applied and that seems to be doing well for him. Electronic Signature(s) Signed: 04/19/2021 12:54:06 PM By: Lenda KelpStone III, Vilma Will PA-C Entered By: Lenda KelpStone III, Eleazar Kimmey on 04/19/2021 12:54:06 [name], [name] ([id number]) -------------------------------------------------------------------------------- Physician Orders Details Patient Name: [name], [name] Date of Service: 04/19/2021 11:30 AM Medical Record Number: [id number] Patient Account Number: [id number] Date of Birth/Sex: Jun 09, 1985 (35 y.o. 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 Remove old dressing, discard into plastic bag and place into trash. Cleanse the wound with Normal Saline prior to applying a clean dressing using gauze sponges, not tissues or cotton balls. Do not scrub or use excessive force. Pat dry using gauze sponges, not tissue or cotton balls. Cleanser: Soap and Water 1 x Per Day/30 Days Discharge Instructions: Gently cleanse wound with antibacterial soap, rinse and pat dry prior to dressing wounds Topical: Triamcinolone Acetonide Cream, 0.1%, 15 (g) tube 1 x Per Day/30 Days Discharge Instructions: thin layer Topical: Gold Bond powder 1 x Per Day/30 Days Discharge Instructions: OTC Athletes foot spray or power Wound #2 - Back Wound Laterality: Right Cleanser: Normal Saline 1 x Per Day/30 Days Discharge Instructions: Wash your hands with soap and water. Remove old dressing, discard into plastic bag and place into trash. Cleanse the wound with Normal Saline prior to applying a clean dressing using gauze sponges, not tissues or cotton balls. Do not scrub or use excessive force. Pat dry using gauze sponges, not tissue or cotton balls. Cleanser: Soap and Water 1 x Per Day/30 Days Discharge Instructions: Gently cleanse wound  with antibacterial soap, rinse and pat dry prior to dressing wounds Topical: Triamcinolone Acetonide Cream, 0.1%, 15 (g) tube 1 x Per Day/30 Days Discharge Instructions: thin layer Topical: fungal spray 1 x Per Day/30 Days Discharge Instructions: OTC Athletes foot spray or power Electronic Signature(s) Signed: 04/19/2021 4:02:12 PM By: Hansel Feinstein Signed: 04/19/2021 5:59:23 PM By: Lenda Kelp PA-C Entered By: Hansel Feinstein on 04/19/2021 12:05:09 [name] ([id number]) -------------------------------------------------------------------------------- Problem List Details Patient Name: [name] Date of Service: 04/19/2021 11:30 AM Medical Record Number: [id number] Patient Account Number: [id number] Date of Birth/Sex: [birth year] (35 y.o. 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 M) Treating RN: Hansel Feinstein Primary Care Provider: Unknown Foley Other Clinician: Referring Provider: Unknown Foley Treating Provider/Extender: Rowan Blase in  Treatment: 4 Subjective Chief Complaint Information obtained from Patient Back Ulcers History of Present Illness (HPI) 03/18/2021 upon evaluation today patient presents for initial inspection here in the clinic concerning issues that he has been having with his back. Fortunately this seems to be related to multiple potential issues here which involve moisture, pressure, and I believe a fungal infection as well. In the past he seems to have responded to steroids topically. With that being said I am thinking that might still be something that could be helpful for him. I am going to see about setting this and to  the pharmacy to get things started. I also think we may want to do a wound culture in order to evaluate for any possibilities there. 04/19/2021 upon evaluation today patient actually appears to be doing excellent in regard to his back. He is actually making wonderful progress as far as this is concerned. I do not see any signs of infection at this time which is great news and overall I think that he is doing quite well. Objective Constitutional Well-nourished and well-hydrated in no acute distress. Vitals Time Taken: 11:45 AM, Height: 74 in, Weight: 180 lbs, BMI: 23.1, Temperature: 97.9 F, Pulse: 89 bpm, Respiratory Rate: 16 breaths/min, Blood Pressure: 109/73 mmHg. Respiratory normal breathing without difficulty. Psychiatric this patient is able to make decisions and demonstrates good insight into disease process. Alert and Oriented x 3. pleasant and cooperative. General Notes: Patient's wound bed actually showed signs of good granulation and epithelization at this point in fact there is hardly anything left open at this time and I think that the triamcinolone has been of great benefit for him. Overall I think that we are headed in the right direction he is using Goldbond over top of the triamcinolone once applied and that seems to be doing well for him. Integumentary (Hair,  Skin) Wound #1 status is Open. Original cause of wound was Gradually Appeared. The date acquired was: 12/07/2017. The wound has been in treatment 4 weeks. The wound is located on the Left Back. The wound measures 21cm length x 16cm width x 0.1cm depth; 263.894cm^2 area and 26.389cm^3 volume. There is Fat Layer (Subcutaneous Tissue) exposed. There is no tunneling or undermining noted. There is a medium amount of serosanguineous drainage noted. There is medium (34-66%) red, pink granulation within the wound bed. There is a medium (34-66%) amount of necrotic tissue within the wound bed including Adherent Slough. Wound #2 status is Open. Original cause of wound was Gradually Appeared. The date acquired was: 12/06/2017. The wound has been in treatment 4 weeks. The wound is located on the Right Back. The wound measures 25cm length x 11cm width x 0.1cm depth; 215.984cm^2 area and 21.598cm^3 volume. There is Fat Layer (Subcutaneous Tissue) exposed. There is no tunneling or undermining noted. There is a medium amount of serosanguineous drainage noted. There is medium (34-66%) red, pink granulation within the wound bed. There is a medium (34-66%) amount of necrotic tissue within the wound bed including Adherent Slough. 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 Remove old dressing, discard into plastic bag and place into trash. Cleanse the wound with Normal Saline prior to applying a clean dressing using  gauze sponges, not tissues or cotton balls. Do not scrub or use excessive force. Pat dry using gauze sponges, not tissue or cotton balls. Cleanser: Soap and Water 1 x Per Day/30 Days Discharge Instructions: Gently cleanse wound with antibacterial soap, rinse and pat dry prior to dressing wounds Topical: Triamcinolone Acetonide Cream, 0.1%, 15 (g) tube 1 x Per Day/30 Days Discharge Instructions: thin layer Topical: Gold Bond powder 1 x Per Day/30 Days Discharge Instructions: OTC Athletes foot  spray or power WOUND #2: - Back Wound Laterality: Right Cleanser: Normal Saline 1 x Per Day/30 Days Discharge Instructions: Wash your hands with soap and water. Remove old dressing, discard into plastic bag and place into trash. Cleanse the wound with Normal Saline prior to applying a clean dressing using gauze sponges, not tissues or cotton balls. Do not scrub or use excessive force. Pat dry using gauze sponges, not tissue or cotton balls. Cleanser: Soap and Water 1 x Per Day/30 Days Discharge Instructions: Gently cleanse wound with antibacterial soap, rinse and pat dry prior to dressing wounds Topical: Triamcinolone Acetonide Cream, 0.1%, 15 (g) tube 1 x Per Day/30 Days Discharge Instructions: thin layer Topical: fungal spray 1 x Per Day/30 Days Discharge Instructions: OTC Athletes foot spray or power 1. Would recommend currently that we going to continue with the wound care measures as before and the patient is in agreement the plan. This includes the use of the triamcinolone which I think is doing a good job. He is using Goldbond sprinkled over top of this. 2. I am also can recommend that he continue to try to keep the area nice and dry as far as his back is concerned obviously I think this is the biggest issue coupled with the anti-inflammatory that is doing a great job. We will see patient back for reevaluation in 1 Month here in the clinic. If anything worsens or changes patient will contact our  office for additional recommendations. Electronic Signature(s) Signed: 04/19/2021 12:54:58 PM By: Lenda Kelp PA-C Entered By: Lenda Kelp on 04/19/2021 12:54:58 [name] ([id number]) -------------------------------------------------------------------------------- SuperBill Details Patient Name: [name] Date of Service: 04/19/2021 Medical Record Number: [id number] Patient Account Number: [id number] Date of Birth/Sex: 1985/04/28 (35 y.o. 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 G82.22 Paraplegia, incomplete Facility Procedures CPT4 Code: [id number] Description: 99213 - WOUND CARE VISIT-LEV 3 EST PT Modifier: Quantity: 1 Physician Procedures CPT4 Code: [id number] Description: 99214 - WC PHYS LEVEL 4 - EST PT Modifier: Quantity: 1 CPT4 Code: Description: ICD-10 Diagnosis Description L98.8 Other specified disorders of the skin and subcutaneous tissue B35.4 Tinea corporis 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

## 2021-04-19 NOTE — Progress Notes (Signed)
[name] ([id number]) Visit Report for 04/19/2021 Arrival Information Details Patient Name: [name] Date of Service: 04/19/2021 11:30 AM Medical Record Number: [id number] Patient Account Number: [id number] Date of Birth/Sex: September 06, 1985 (36 y.o. M) Treating RN: Donnamarie Poag Primary Care Otho Michalik: August Luz Other Clinician: Referring Weda Baumgarner: August Luz Treating Terrace Chiem/Extender: Skipper Cliche in Treatment: 4 Visit Information History Since Last Visit Added or deleted any medications: No Patient Arrived: Wheel Chair Had a fall or experienced change in No Arrival Time: 11:47 activities of daily living that may affect Accompanied By: sister risk of falls: Transfer Assistance: None Hospitalized since last visit: No Patient Identification Verified: Yes Has Dressing in Place as Prescribed: Yes Secondary Verification Process Completed: Yes Pain Present Now: No Patient Requires Transmission-Based Precautions: No Patient Has Alerts: No Electronic Signature(s) Signed: 04/19/2021 4:02:12 PM By: Donnamarie Poag Entered By: Donnamarie Poag on 04/19/2021 11:48:41 [name] ([id number]) -------------------------------------------------------------------------------- Clinic Level of Care Assessment Details Patient Name: [name] Date of Service: 04/19/2021 11:30 AM Medical Record Number: [id number] Patient Account Number: [id number] Date of Birth/Sex: 22-Nov-1985 (35 y.o. M) Treating RN: Donnamarie Poag Primary Care Trevion Hoben: August Luz Other Clinician: Referring Eliannah Hinde: August Luz Treating Antonius Hartlage/Extender: Skipper Cliche in Treatment: 4 Clinic Level of Care Assessment Items TOOL 4 Quantity Score _0  - Use when only an EandM is performed on FOLLOW-UP visit 0 ASSESSMENTS - Nursing Assessment / Reassessment _1  - Reassessment of Co-morbidities (includes updates in patient status) 0 _2  - 0 Reassessment of Adherence to Treatment Plan ASSESSMENTS - Wound and Skin  Assessment / Reassessment _3  - Simple Wound Assessment / Reassessment - one wound 0 X- 2 5 Complex Wound Assessment / Reassessment - multiple wounds _4  - 0 Dermatologic / Skin Assessment (not related to wound area) ASSESSMENTS - Focused Assessment _5  - Circumferential Edema Measurements - multi extremities 0 _6  - 0 Nutritional Assessment / Counseling / Intervention _7  - 0 Lower Extremity Assessment (monofilament, tuning fork, pulses) _8  - 0 Peripheral Arterial Disease Assessment (using hand held doppler) ASSESSMENTS - Ostomy and/or Continence Assessment and Care _9  - Incontinence Assessment and Management 0 _10  - 0 Ostomy Care Assessment and Management (repouching, etc.) PROCESS - Coordination of Care X - Simple Patient / Family Education for ongoing care 1 15 _11  - 0 Complex (extensive) Patient / Family Education for ongoing care _12  - 0 Staff obtains Programmer, systems, Records, Test Results / Process Orders _13  - 0 Staff telephones HHA, Nursing Homes / Clarify orders / etc _14  - 0 Routine Transfer to another Facility (non-emergent condition) _15  - 0 Routine Hospital Admission (non-emergent condition) _16  - 0 New Admissions / Biomedical engineer / Ordering NPWT, Apligraf, etc. _17  - 0 Emergency Hospital Admission (emergent condition) X- 1 10 Simple Discharge Coordination _18  - 0 Complex (extensive) Discharge Coordination PROCESS - Special Needs _19  - Pediatric / Minor Patient Management 0 _20  - 0 Isolation Patient Management _21  - 0 Hearing / Language / Visual special needs _22  - 0 Assessment of Community assistance (transportation, D/C planning, etc.) _23  - 0 Additional assistance / Altered mentation _24  - 0 Support Surface(s) Assessment (bed, cushion, seat, etc.) INTERVENTIONS - Wound Cleansing / Measurement [name] ([id number]) _25  - 0 Simple Wound Cleansing - one wound X- 2 5 Complex Wound Cleansing - multiple wounds X- 1 5 Wound Imaging (photographs - any number of  wounds) _26  - 0 Wound Tracing (instead of photographs) _27  - 0 Simple Wound Measurement - one wound X- 2 5 Complex Wound Measurement - multiple wounds INTERVENTIONS - Wound  Dressings X - Small Wound Dressing one or multiple wounds 2 10 _0  - 0 Medium Wound Dressing one or multiple wounds _1  - 0 Large Wound Dressing one or multiple wounds <ZOXWRUEAVWUJWJXB>_1<\/YNWGNFAOZHYQMVHQ>_4  - 0 Application of Medications - topical <ONGEXBMWUXLKGMWN>_0<\/UVOZDGUYQIHKVQQV>_9  - 0 Application of Medications - injection INTERVENTIONS - Miscellaneous _4  - External ear exam 0 _5  - 0 Specimen Collection (cultures, biopsies, blood, body fluids, etc.) _6  - 0 Specimen(s) / Culture(s) sent or taken to Lab for analysis _7  - 0 Patient Transfer (multiple staff / Harrel Lemon Lift / Similar devices) _8  - 0 Simple Staple / Suture removal (25 or less) _9  - 0 Complex Staple / Suture removal (26 or more) _10  - 0 Hypo / Hyperglycemic Management (close monitor of Blood Glucose) _11  - 0 Ankle / Brachial Index (ABI) - do not check if billed separately X- 1 5 Vital Signs Has the patient been seen at the hospital within the last three years: Yes Total Score: 85 Level Of Care: New/Established - Level 3 Electronic Signature(s) Signed: 04/19/2021 4:02:12 PM By: Donnamarie Poag Entered By: Donnamarie Poag on 04/19/2021 12:11:43 [name] ([id number]) -------------------------------------------------------------------------------- Encounter Discharge Information Details Patient Name: [name] Date of Service: 04/19/2021 11:30 AM Medical Record Number: [id number] Patient Account Number: [id number] Date of Birth/Sex: [birth year] (35 y.o. M) Treating RN: Donnamarie Poag Primary Care Analese Sovine: August Luz Other Clinician: Referring Janit Cutter: August Luz Treating Lachlyn Vanderstelt/Extender: Skipper Cliche in Treatment: 4 Encounter Discharge Information Items Discharge Condition: Stable Ambulatory Status: Walker Discharge Destination: Home Transportation: Private Auto Accompanied By: sister Schedule  Follow-up Appointment: Yes Clinical Summary of Care: Electronic Signature(s) Signed: 04/19/2021 4:02:12 PM By: Donnamarie Poag Entered By: Donnamarie Poag on 04/19/2021 12:17:06 [name] ([id number]) -------------------------------------------------------------------------------- Lower Extremity Assessment Details Patient Name: [name] Date of Service: 04/19/2021 11:30 AM Medical Record Number: [id number] Patient Account Number: [id number] Date of Birth/Sex: October 20, 1985 (35 y.o. M) Treating RN: Donnamarie Poag Primary Care Peola Joynt: August Luz Other Clinician: Referring Dasiah Hooley: August Luz Treating Haddie Bruhl/Extender: Jeri Cos Weeks in Treatment: 4 Electronic Signature(s) Signed: 04/19/2021 4:02:12 PM By: Donnamarie Poag Entered By: Donnamarie Poag on 04/19/2021 11:54:52 [name] ([id number]) -------------------------------------------------------------------------------- Multi Wound Chart Details Patient Name: [name] Date of Service: 04/19/2021 11:30 AM Medical Record Number: [id number] Patient Account Number: [id number] Date of Birth/Sex: [birth year] (35 y.o. M) Treating RN: Donnamarie Poag Primary Care Jacqulene Huntley: August Luz Other Clinician: Referring Emalina Dubreuil: August Luz Treating Carmen Vallecillo/Extender: Jeri Cos Weeks in Treatment: 4 Vital Signs Height(in): 74 Pulse(bpm): 89 Weight(lbs): 180 Blood Pressure(mmHg): 109/73 Body Mass Index(BMI): 23 Temperature(F): 97.9 Respiratory Rate(breaths/min): 16 Photos: [N/A:N/A] Wound Location: Left Back Right Back N/A Wounding Event: Gradually Appeared Gradually Appeared N/A Primary Etiology: Atypical Atypical N/A Date Acquired: 12/07/2017 12/06/2017 N/A Weeks of Treatment: 4 4 N/A Wound Status: Open Open N/A Clustered Wound: Yes No N/A Measurements L x W x D (cm) 21x16x0.1 25x11x0.1 N/A Area (cm) : 263.894 215.984 N/A Volume (cm) : 26.389 21.598 N/A % Reduction in Area: 60.50% 14.60% N/A % Reduction in Volume: 80.20%  57.30% N/A Classification: Full Thickness Without Exposed Full Thickness Without Exposed N/A Support Structures Support Structures Exudate Amount: Medium Medium N/A Exudate Type: Serosanguineous Serosanguineous N/A Exudate Color: red, brown red, brown N/A Granulation Amount: Medium (34-66%) Medium (34-66%) N/A Granulation Quality: Red, Pink Red, Pink N/A Necrotic Amount: Medium (34-66%) Medium (34-66%) N/A Exposed Structures: Fat Layer (Subcutaneous Tissue): Fat Layer (Subcutaneous Tissue): N/A Yes Yes Fascia: No Tendon: No Muscle: No Joint: No Bone: No Epithelialization: None Large (67-100%) N/A Treatment Notes Electronic Signature(s) Signed:  04/19/2021 4:02:12 PM By: Donnamarie Poag Entered By: Donnamarie Poag on 04/19/2021 11:56:07 [name] ([id number]) -------------------------------------------------------------------------------- Multi-Disciplinary Care Plan Details Patient Name: [name] Date of Service: 04/19/2021 11:30 AM Medical Record Number: [id number] Patient Account Number: [id number] Date of Birth/Sex: 29-May-1985 (35 y.o. M) Treating RN: Donnamarie Poag Primary Care Elius Etheredge: August Luz Other Clinician: Referring Hardy Harcum: August Luz Treating Kenli Waldo/Extender: Jeri Cos Weeks in Treatment: 4 Active Inactive Wound/Skin Impairment Nursing Diagnoses: Knowledge deficit related to ulceration/compromised skin integrity Goals: Patient/caregiver will verbalize understanding of skin care regimen Date Initiated: 03/18/2021 Date Inactivated: 04/19/2021 Target Resolution Date: 04/18/2021 Goal Status: Met Ulcer/skin breakdown will have a volume reduction of 30% by week 4 Date Initiated: 03/18/2021 Date Inactivated: 04/19/2021 Target Resolution Date: 04/18/2021 Goal Status: Met Ulcer/skin breakdown will have a volume reduction of 50% by week 8 Date Initiated: 03/18/2021 Target Resolution Date: 05/19/2021 Goal Status: Active Ulcer/skin breakdown will have a  volume reduction of 80% by week 12 Date Initiated: 03/18/2021 Target Resolution Date: 06/16/2021 Goal Status: Active Ulcer/skin breakdown will heal within 14 weeks Date Initiated: 03/18/2021 Target Resolution Date: 07/17/2021 Goal Status: Active Interventions: Assess patient/caregiver ability to obtain necessary supplies Assess patient/caregiver ability to perform ulcer/skin care regimen upon admission and as needed Assess ulceration(s) every visit Notes: Electronic Signature(s) Signed: 04/19/2021 4:02:12 PM By: Donnamarie Poag Entered By: Donnamarie Poag on 04/19/2021 11:55:56 [name] ([id number]) -------------------------------------------------------------------------------- Pain Assessment Details Patient Name: [name] Date of Service: 04/19/2021 11:30 AM Medical Record Number: [id number] Patient Account Number: [id number] Date of Birth/Sex: Dec 05, 1985 (35 y.o. M) Treating RN: Donnamarie Poag Primary Care Nikitia Asbill: August Luz Other Clinician: Referring Ellason Segar: August Luz Treating Latroy Gaymon/Extender: Skipper Cliche in Treatment: 4 Active Problems Location of Pain Severity and Description of Pain Patient Has Paino No Site Locations Rate the pain. Current Pain Level: 0 Pain Management and Medication Current Pain Management: Electronic Signature(s) Signed: 04/19/2021 4:02:12 PM By: Donnamarie Poag Entered By: Donnamarie Poag on 04/19/2021 11:50:17 [name] ([id number]) -------------------------------------------------------------------------------- Patient/Caregiver Education Details Patient Name: [name] Date of Service: 04/19/2021 11:30 AM Medical Record Number: [id number] Patient Account Number: [id number] Date of Birth/Gender: 1986-02-26 (35 y.o. M) Treating RN: Donnamarie Poag Primary Care Physician: August Luz Other Clinician: Referring Physician: August Luz Treating Physician/Extender: Skipper Cliche in Treatment: 4 Education Assessment Education  Provided To: Patient and Caregiver Education Topics Provided Basic Hygiene: Wound/Skin Impairment: Electronic Signature(s) Signed: 04/19/2021 4:02:12 PM By: Donnamarie Poag Entered By: Donnamarie Poag on 04/19/2021 12:12:19 [name] ([id number]) -------------------------------------------------------------------------------- Wound Assessment Details Patient Name: [name] Date of Service: 04/19/2021 11:30 AM Medical Record Number: [id number] Patient Account Number: [id number] Date of Birth/Sex: 11/01/1985 (35 y.o. M) Treating RN: Donnamarie Poag Primary Care Zohar Maroney: August Luz Other Clinician: Referring Junell Cullifer: August Luz Treating Jeannette Maddy/Extender: Jeri Cos Weeks in Treatment: 4 Wound Status Wound Number: 1 Primary Etiology: Atypical Wound Location: Left Back Wound Status: Open Wounding Event: Gradually Appeared Date Acquired: 12/07/2017 Weeks Of Treatment: 4 Clustered Wound: Yes Photos Wound Measurements Length: (cm) 21 Width: (cm) 16 Depth: (cm) 0.1 Area: (cm) 263.894 Volume: (cm) 26.389 % Reduction in Area: 60.5% % Reduction in Volume: 80.2% Epithelialization: None Tunneling: No Undermining: No Wound Description Classification: Full Thickness Without Exposed Support Structures Exudate Amount: Medium Exudate Type: Serosanguineous Exudate Color: red, brown Foul Odor After Cleansing: No Slough/Fibrino Yes Wound Bed Granulation Amount: Medium (34-66%) Exposed Structure Granulation Quality: Red, Pink Fat Layer (Subcutaneous Tissue) Exposed: Yes Necrotic Amount: Medium (34-66%) Necrotic Quality: Adherent Slough Treatment Notes Wound #1 (Back) Wound Laterality: Left Cleanser  Normal Saline Discharge Instruction: Wash your hands with soap and water. Remove old dressing, discard into plastic bag and place into trash. Cleanse the wound with Normal Saline prior to applying a clean dressing using gauze sponges, not tissues or cotton balls. Do not scrub or  use excessive force. Pat dry using gauze sponges, not tissue or cotton balls. Soap and Water Discharge Instruction: Gently cleanse wound with antibacterial soap, rinse and pat dry prior to dressing wounds Peri-Wound Care DINARI, STGERMAINE ([id number]) Topical Triamcinolone Acetonide Cream, 0.1%, 15 (g) tube Discharge Instruction: thin layer Gold Bond powder Discharge Instruction: OTC Athletes foot spray or power Primary Dressing Secondary Dressing Secured With Compression Wrap Compression Stockings Add-Ons Electronic Signature(s) Signed: 04/19/2021 4:02:12 PM By: Donnamarie Poag Entered By: Donnamarie Poag on 04/19/2021 11:53:34 [name] ([id number]) -------------------------------------------------------------------------------- Wound Assessment Details Patient Name: [name] Date of Service: 04/19/2021 11:30 AM Medical Record Number: [id number] Patient Account Number: [id number] Date of Birth/Sex: Jun 06, 1985 (35 y.o. M) Treating RN: Donnamarie Poag Primary Care Cody Albus: August Luz Other Clinician: Referring Binh Doten: August Luz Treating Jakiyah Stepney/Extender: Jeri Cos Weeks in Treatment: 4 Wound Status Wound Number: 2 Primary Etiology: Atypical Wound Location: Right Back Wound Status: Open Wounding Event: Gradually Appeared Date Acquired: 12/06/2017 Weeks Of Treatment: 4 Clustered Wound: No Photos Wound Measurements Length: (cm) 25 Width: (cm) 11 Depth: (cm) 0.1 Area: (cm) 215.984 Volume: (cm) 21.598 % Reduction in Area: 14.6% % Reduction in Volume: 57.3% Epithelialization: Large (67-100%) Tunneling: No Undermining: No Wound Description Classification: Full Thickness Without Exposed Support Structures Exudate Amount: Medium Exudate Type: Serosanguineous Exudate Color: red, brown Foul Odor After Cleansing: No Slough/Fibrino Yes Wound Bed Granulation Amount: Medium (34-66%) Exposed Structure Granulation Quality: Red, Pink Fascia Exposed: No Necrotic Amount:  Medium (34-66%) Fat Layer (Subcutaneous Tissue) Exposed: Yes Necrotic Quality: Adherent Slough Tendon Exposed: No Muscle Exposed: No Joint Exposed: No Bone Exposed: No Treatment Notes Wound #2 (Back) Wound Laterality: Right Cleanser Normal Saline Discharge Instruction: Wash your hands with soap and water. Remove old dressing, discard into plastic bag and place into trash. Cleanse the wound with Normal Saline prior to applying a clean dressing using gauze sponges, not tissues or cotton balls. Do not scrub or use excessive force. Pat dry using gauze sponges, not tissue or cotton balls. Soap and Water Turney, Barbaraann Rondo ([id number]) Discharge Instruction: Gently cleanse wound with antibacterial soap, rinse and pat dry prior to dressing wounds Peri-Wound Care Topical Triamcinolone Acetonide Cream, 0.1%, 15 (g) tube Discharge Instruction: thin layer fungal spray Discharge Instruction: OTC Athletes foot spray or power Primary Dressing Secondary Dressing Secured With Compression Wrap Compression Stockings Add-Ons Electronic Signature(s) Signed: 04/19/2021 4:02:12 PM By: Donnamarie Poag Entered By: Donnamarie Poag on 04/19/2021 11:54:38 [name] ([id number]) -------------------------------------------------------------------------------- Vitals Details Patient Name: [name] Date of Service: 04/19/2021 11:30 AM Medical Record Number: [id number] Patient Account Number: [id number] Date of Birth/Sex: [birth year] (35 y.o. M) Treating RN: Donnamarie Poag Primary Care Field Staniszewski: August Luz Other Clinician: Referring Aubriella Perezgarcia: August Luz Treating Faustine Tates/Extender: Jeri Cos Weeks in Treatment: 4 Vital Signs Time Taken: 11:45 Temperature (F): 97.9 Height (in): 74 Pulse (bpm): 89 Weight (lbs): 180 Respiratory Rate (breaths/min): 16 Body Mass Index (BMI): 23.1 Blood Pressure (mmHg): 109/73 Reference Range: 80 - 120 mg / dl Electronic Signature(s) Signed: 04/19/2021 4:02:12 PM By:  Donnamarie Poag Entered ByDonnamarie Poag on 04/19/2021 11:50:04

## 2021-05-17 ENCOUNTER — Ambulatory Visit: Payer: Medicaid Other | Admitting: Physician Assistant

## 2021-05-24 ENCOUNTER — Ambulatory Visit: Payer: Medicare (Managed Care) | Admitting: Internal Medicine

## 2021-06-10 ENCOUNTER — Ambulatory Visit: Payer: Medicare (Managed Care) | Admitting: Physician Assistant

## 2021-07-12 ENCOUNTER — Inpatient Hospital Stay
Admission: EM | Admit: 2021-07-12 | Discharge: 2021-07-17 | DRG: 698 | Disposition: A | Discharge status: Home / Self Care | Payer: Medicare (Managed Care) | Attending: Internal Medicine | Admitting: Internal Medicine

## 2021-07-12 ENCOUNTER — Other Ambulatory Visit: Payer: Self-pay

## 2021-07-12 ENCOUNTER — Encounter: Payer: Self-pay | Admitting: Internal Medicine

## 2021-07-12 ENCOUNTER — Emergency Department: Payer: Medicare (Managed Care)

## 2021-07-12 DIAGNOSIS — E871 Hypo-osmolality and hyponatremia: Secondary | ICD-10-CM | POA: Diagnosis present

## 2021-07-12 DIAGNOSIS — Z87891 Personal history of nicotine dependence: Secondary | ICD-10-CM | POA: Diagnosis not present

## 2021-07-12 DIAGNOSIS — B965 Pseudomonas (aeruginosa) (mallei) (pseudomallei) as the cause of diseases classified elsewhere: Secondary | ICD-10-CM | POA: Diagnosis not present

## 2021-07-12 DIAGNOSIS — K219 Gastro-esophageal reflux disease without esophagitis: Secondary | ICD-10-CM | POA: Diagnosis present

## 2021-07-12 DIAGNOSIS — Z0389 Encounter for observation for other suspected diseases and conditions ruled out: Secondary | ICD-10-CM | POA: Diagnosis not present

## 2021-07-12 DIAGNOSIS — D509 Iron deficiency anemia, unspecified: Secondary | ICD-10-CM | POA: Diagnosis present

## 2021-07-12 DIAGNOSIS — Y738 Miscellaneous gastroenterology and urology devices associated with adverse incidents, not elsewhere classified: Secondary | ICD-10-CM | POA: Diagnosis present

## 2021-07-12 DIAGNOSIS — X58XXXA Exposure to other specified factors, initial encounter: Secondary | ICD-10-CM | POA: Diagnosis present

## 2021-07-12 DIAGNOSIS — Y92009 Unspecified place in unspecified non-institutional (private) residence as the place of occurrence of the external cause: Secondary | ICD-10-CM

## 2021-07-12 DIAGNOSIS — S82402A Unspecified fracture of shaft of left fibula, initial encounter for closed fracture: Secondary | ICD-10-CM | POA: Diagnosis not present

## 2021-07-12 DIAGNOSIS — R29898 Other symptoms and signs involving the musculoskeletal system: Secondary | ICD-10-CM | POA: Diagnosis not present

## 2021-07-12 DIAGNOSIS — Z978 Presence of other specified devices: Secondary | ICD-10-CM

## 2021-07-12 DIAGNOSIS — R509 Fever, unspecified: Secondary | ICD-10-CM | POA: Diagnosis not present

## 2021-07-12 DIAGNOSIS — S82832A Other fracture of upper and lower end of left fibula, initial encounter for closed fracture: Secondary | ICD-10-CM | POA: Diagnosis present

## 2021-07-12 DIAGNOSIS — S82832K Other fracture of upper and lower end of left fibula, subsequent encounter for closed fracture with nonunion: Secondary | ICD-10-CM | POA: Diagnosis not present

## 2021-07-12 DIAGNOSIS — A4151 Sepsis due to Escherichia coli [E. coli]: Secondary | ICD-10-CM | POA: Diagnosis present

## 2021-07-12 DIAGNOSIS — S82442A Displaced spiral fracture of shaft of left fibula, initial encounter for closed fracture: Secondary | ICD-10-CM | POA: Diagnosis not present

## 2021-07-12 DIAGNOSIS — A419 Sepsis, unspecified organism: Secondary | ICD-10-CM | POA: Diagnosis not present

## 2021-07-12 DIAGNOSIS — S82302A Unspecified fracture of lower end of left tibia, initial encounter for closed fracture: Secondary | ICD-10-CM | POA: Diagnosis not present

## 2021-07-12 DIAGNOSIS — D500 Iron deficiency anemia secondary to blood loss (chronic): Secondary | ICD-10-CM | POA: Diagnosis present

## 2021-07-12 DIAGNOSIS — N3000 Acute cystitis without hematuria: Secondary | ICD-10-CM | POA: Diagnosis not present

## 2021-07-12 DIAGNOSIS — N390 Urinary tract infection, site not specified: Secondary | ICD-10-CM | POA: Diagnosis present

## 2021-07-12 DIAGNOSIS — I959 Hypotension, unspecified: Secondary | ICD-10-CM | POA: Diagnosis present

## 2021-07-12 DIAGNOSIS — M7989 Other specified soft tissue disorders: Secondary | ICD-10-CM | POA: Diagnosis not present

## 2021-07-12 DIAGNOSIS — L89109 Pressure ulcer of unspecified part of back, unspecified stage: Secondary | ICD-10-CM | POA: Diagnosis present

## 2021-07-12 DIAGNOSIS — T83518A Infection and inflammatory reaction due to other urinary catheter, initial encounter: Secondary | ICD-10-CM | POA: Diagnosis not present

## 2021-07-12 DIAGNOSIS — S99922A Unspecified injury of left foot, initial encounter: Secondary | ICD-10-CM | POA: Diagnosis not present

## 2021-07-12 DIAGNOSIS — R52 Pain, unspecified: Secondary | ICD-10-CM | POA: Diagnosis not present

## 2021-07-12 DIAGNOSIS — E876 Hypokalemia: Secondary | ICD-10-CM | POA: Diagnosis not present

## 2021-07-12 DIAGNOSIS — B9620 Unspecified Escherichia coli [E. coli] as the cause of diseases classified elsewhere: Secondary | ICD-10-CM | POA: Diagnosis not present

## 2021-07-12 DIAGNOSIS — S82302K Unspecified fracture of lower end of left tibia, subsequent encounter for closed fracture with nonunion: Secondary | ICD-10-CM | POA: Diagnosis not present

## 2021-07-12 DIAGNOSIS — R9431 Abnormal electrocardiogram [ECG] [EKG]: Secondary | ICD-10-CM | POA: Diagnosis not present

## 2021-07-12 DIAGNOSIS — S82242A Displaced spiral fracture of shaft of left tibia, initial encounter for closed fracture: Secondary | ICD-10-CM | POA: Diagnosis present

## 2021-07-12 DIAGNOSIS — R7881 Bacteremia: Secondary | ICD-10-CM | POA: Diagnosis not present

## 2021-07-12 DIAGNOSIS — G8250 Quadriplegia, unspecified: Secondary | ICD-10-CM | POA: Diagnosis present

## 2021-07-12 DIAGNOSIS — S82392A Other fracture of lower end of left tibia, initial encounter for closed fracture: Secondary | ICD-10-CM | POA: Diagnosis not present

## 2021-07-12 DIAGNOSIS — R5381 Other malaise: Secondary | ICD-10-CM | POA: Diagnosis not present

## 2021-07-12 DIAGNOSIS — Z7401 Bed confinement status: Secondary | ICD-10-CM | POA: Diagnosis not present

## 2021-07-12 DIAGNOSIS — L308 Other specified dermatitis: Secondary | ICD-10-CM | POA: Diagnosis present

## 2021-07-12 DIAGNOSIS — Z8249 Family history of ischemic heart disease and other diseases of the circulatory system: Secondary | ICD-10-CM | POA: Diagnosis not present

## 2021-07-12 HISTORY — DX: Quadriplegia, unspecified: G82.50

## 2021-07-12 HISTORY — DX: Gastro-esophageal reflux disease without esophagitis: K21.9

## 2021-07-12 HISTORY — DX: Presence of other specified devices: Z97.8

## 2021-07-12 LAB — CBC WITH DIFFERENTIAL/PLATELET
Abs Immature Granulocytes: 0.05 10*3/uL (ref 0.00–0.07)
Basophils Absolute: 0 10*3/uL (ref 0.0–0.1)
Basophils Relative: 0 %
Eosinophils Absolute: 0 10*3/uL (ref 0.0–0.5)
Eosinophils Relative: 0 %
HCT: 29.3 % — ABNORMAL LOW (ref 39.0–52.0)
Hemoglobin: 8.2 g/dL — ABNORMAL LOW (ref 13.0–17.0)
Immature Granulocytes: 0 %
Lymphocytes Relative: 5 %
Lymphs Abs: 0.5 10*3/uL — ABNORMAL LOW (ref 0.7–4.0)
MCH: 17.8 pg — ABNORMAL LOW (ref 26.0–34.0)
MCHC: 28 g/dL — ABNORMAL LOW (ref 30.0–36.0)
MCV: 63.6 fL — ABNORMAL LOW (ref 80.0–100.0)
Monocytes Absolute: 1.1 10*3/uL — ABNORMAL HIGH (ref 0.1–1.0)
Monocytes Relative: 10 %
Neutro Abs: 9.9 10*3/uL — ABNORMAL HIGH (ref 1.7–7.7)
Neutrophils Relative %: 85 %
Platelets: 367 10*3/uL (ref 150–400)
RBC: 4.61 MIL/uL (ref 4.22–5.81)
RDW: 19.4 % — ABNORMAL HIGH (ref 11.5–15.5)
Smear Review: NORMAL
WBC: 11.6 10*3/uL — ABNORMAL HIGH (ref 4.0–10.5)
nRBC: 0 % (ref 0.0–0.2)

## 2021-07-12 LAB — COMPREHENSIVE METABOLIC PANEL
ALT: 32 U/L (ref 0–44)
AST: 29 U/L (ref 15–41)
Albumin: 3 g/dL — ABNORMAL LOW (ref 3.5–5.0)
Alkaline Phosphatase: 106 U/L (ref 38–126)
Anion gap: 12 (ref 5–15)
BUN: 9 mg/dL (ref 6–20)
CO2: 21 mmol/L — ABNORMAL LOW (ref 22–32)
Calcium: 8.6 mg/dL — ABNORMAL LOW (ref 8.9–10.3)
Chloride: 100 mmol/L (ref 98–111)
Creatinine, Ser: 0.4 mg/dL — ABNORMAL LOW (ref 0.61–1.24)
GFR, Estimated: >60 mL/min (ref >60)
Glucose, Bld: 85 mg/dL (ref 70–99)
Potassium: 3.9 mmol/L (ref 3.5–5.1)
Sodium: 133 mmol/L — ABNORMAL LOW (ref 135–145)
Total Bilirubin: 2 mg/dL — ABNORMAL HIGH (ref 0.3–1.2)
Total Protein: 7.2 g/dL (ref 6.5–8.1)

## 2021-07-12 LAB — URINALYSIS, COMPLETE (UACMP) WITH MICROSCOPIC
Bilirubin Urine: NEGATIVE
Glucose, UA: NEGATIVE mg/dL
Ketones, ur: 20 mg/dL — AB
Nitrite: POSITIVE — AB
Protein, ur: 100 mg/dL — AB
Specific Gravity, Urine: 1.013 (ref 1.005–1.030)
WBC, UA: >50 WBC/hpf — ABNORMAL HIGH (ref 0–5)
pH: 6 (ref 5.0–8.0)

## 2021-07-12 LAB — APTT: aPTT: 32 seconds (ref 24–36)

## 2021-07-12 LAB — RETICULOCYTES
Immature Retic Fract: 17.5 % — ABNORMAL HIGH (ref 2.3–15.9)
RBC.: 4.62 MIL/uL (ref 4.22–5.81)
Retic Count, Absolute: 51.7 10*3/uL (ref 19.0–186.0)
Retic Ct Pct: 1.1 % (ref 0.4–3.1)

## 2021-07-12 LAB — IRON AND TIBC
Iron: 13 ug/dL — ABNORMAL LOW (ref 45–182)
Saturation Ratios: 6 % — ABNORMAL LOW (ref 17.9–39.5)
TIBC: 227 ug/dL — ABNORMAL LOW (ref 250–450)
UIBC: 214 ug/dL

## 2021-07-12 LAB — LACTIC ACID, PLASMA
Lactic Acid, Venous: 0.9 mmol/L (ref 0.5–1.9)
Lactic Acid, Venous: 0.8 mmol/L (ref 0.5–1.9)

## 2021-07-12 LAB — FERRITIN: Ferritin: 114 ng/mL (ref 24–336)

## 2021-07-12 LAB — SEDIMENTATION RATE: Sed Rate: 81 mm/hr — ABNORMAL HIGH (ref 0–15)

## 2021-07-12 LAB — PROCALCITONIN: Procalcitonin: 2.51 ng/mL

## 2021-07-12 LAB — FOLATE: Folate: 15.1 ng/mL (ref >5.9)

## 2021-07-12 LAB — PROTIME-INR
INR: 1.2 (ref 0.8–1.2)
Prothrombin Time: 15.1 seconds (ref 11.4–15.2)

## 2021-07-12 MED ORDER — MIDODRINE HCL 5 MG PO TABS: 10.0000 mg | ORAL_TABLET | Freq: Every day | ORAL | Status: DC

## 2021-07-12 MED ORDER — ONDANSETRON HCL 4 MG/2ML IJ SOLN
4.0000 mg | Freq: Three times a day (TID) | INTRAMUSCULAR | Status: DC | PRN
Start: 2021-07-12 — End: 2021-07-17
  Administered 2021-07-13 (×2): 4 mg via INTRAVENOUS
  Filled 2021-07-12 (×2): qty 2

## 2021-07-12 MED ORDER — ACETAMINOPHEN 500 MG PO TABS
1000.0000 mg | ORAL_TABLET | Freq: Four times a day (QID) | ORAL | Status: DC | PRN
  Administered 2021-07-12: 1000 mg via ORAL
  Filled 2021-07-12: qty 2

## 2021-07-12 MED ORDER — LACTATED RINGERS IV SOLN: INTRAVENOUS | Status: DC

## 2021-07-12 MED ORDER — LACTATED RINGERS IV BOLUS: 500.0000 mL | Freq: Once | INTRAVENOUS | Status: DC

## 2021-07-12 MED ORDER — ACETAMINOPHEN 325 MG PO TABS
650.0000 mg | ORAL_TABLET | Freq: Four times a day (QID) | ORAL | Status: DC | PRN
  Administered 2021-07-15: 650 mg via ORAL
  Filled 2021-07-12: qty 2

## 2021-07-12 MED ORDER — TRIAMCINOLONE ACETONIDE 0.1 % EX CREA
1.0000 "application " | TOPICAL_CREAM | Freq: Every day | CUTANEOUS | Status: DC
  Administered 2021-07-12: 1 via TOPICAL
  Filled 2021-07-12: qty 15

## 2021-07-12 MED ORDER — LACTATED RINGERS IV BOLUS (SEPSIS)
1000.0000 mL | Freq: Once | INTRAVENOUS | Status: AC
  Administered 2021-07-12: 1000 mL via INTRAVENOUS

## 2021-07-12 MED ORDER — HEPARIN SODIUM (PORCINE) 5000 UNIT/ML IJ SOLN
5000.0000 [IU] | Freq: Three times a day (TID) | INTRAMUSCULAR | Status: DC
  Administered 2021-07-12 – 2021-07-14 (×5): 5000 [IU] via SUBCUTANEOUS
  Filled 2021-07-12 (×5): qty 1

## 2021-07-12 MED ORDER — DOXYCYCLINE HYCLATE 100 MG PO TABS
100.0000 mg | ORAL_TABLET | Freq: Two times a day (BID) | ORAL | Status: DC
  Administered 2021-07-12 – 2021-07-13 (×2): 100 mg via ORAL
  Filled 2021-07-12 (×2): qty 1

## 2021-07-12 MED ORDER — MORPHINE SULFATE (PF) 2 MG/ML IV SOLN: 2.0000 mg | INTRAVENOUS | Status: DC | PRN

## 2021-07-12 MED ORDER — MIDODRINE HCL 5 MG PO TABS
10.0000 mg | ORAL_TABLET | Freq: Two times a day (BID) | ORAL | Status: DC
  Administered 2021-07-12 – 2021-07-17 (×10): 10 mg via ORAL
  Filled 2021-07-12 (×10): qty 2

## 2021-07-12 MED ORDER — METHOCARBAMOL 500 MG PO TABS
500.0000 mg | ORAL_TABLET | Freq: Three times a day (TID) | ORAL | Status: DC | PRN
  Filled 2021-07-12: qty 1

## 2021-07-12 MED ORDER — PANTOPRAZOLE SODIUM 40 MG PO TBEC
40.0000 mg | DELAYED_RELEASE_TABLET | Freq: Every day | ORAL | Status: DC
  Administered 2021-07-12 – 2021-07-17 (×6): 40 mg via ORAL
  Filled 2021-07-12 (×6): qty 1

## 2021-07-12 MED ORDER — ACETAMINOPHEN 500 MG PO TABS: 1000.0000 mg | ORAL_TABLET | Freq: Once | ORAL | Status: DC

## 2021-07-12 MED ORDER — SODIUM CHLORIDE 0.9 % IV SOLN
2.0000 g | Freq: Three times a day (TID) | INTRAVENOUS | Status: DC
  Administered 2021-07-12 – 2021-07-13 (×2): 2 g via INTRAVENOUS
  Filled 2021-07-12 (×2): qty 2
  Filled 2021-07-12: qty 12.5

## 2021-07-12 MED ORDER — OXYCODONE-ACETAMINOPHEN 5-325 MG PO TABS
1.0000 | ORAL_TABLET | ORAL | Status: DC | PRN
  Administered 2021-07-12 – 2021-07-17 (×12): 1 via ORAL
  Filled 2021-07-12 (×13): qty 1

## 2021-07-12 MED ORDER — SODIUM CHLORIDE 0.9 % IV SOLN
2.0000 g | Freq: Once | INTRAVENOUS | Status: AC
  Administered 2021-07-12: 2 g via INTRAVENOUS
  Filled 2021-07-12: qty 12.5

## 2021-07-12 MED ORDER — LACTATED RINGERS IV BOLUS (SEPSIS)
500.0000 mL | Freq: Once | INTRAVENOUS | Status: AC
  Administered 2021-07-12: 500 mL via INTRAVENOUS

## 2021-07-12 MED ORDER — LACTATED RINGERS IV SOLN: INTRAVENOUS | Status: AC

## 2021-07-12 MED ORDER — SODIUM CHLORIDE 0.9 % IV SOLN
INTRAVENOUS | Status: DC | PRN
Start: 2021-07-12 — End: 2021-07-17

## 2021-07-12 NOTE — Progress Notes (Signed)
Patient and family arrived to floor.  Oriented to room and call bell.  Assisted with ordering dinner.  Patient alert and oriented, quad with contractures.  Bolus x2 infusing on arrival to floor.  Telemetry initiated ?

## 2021-07-12 NOTE — Progress Notes (Signed)
Pharmacy Antibiotic Note ? ?[name] is a 36 y.o. male admitted on 07/12/2021 with sepsis/?complicated UTI  Pharmacy has been consulted for Cefepime dosing. ? ?-chronic indwelling foley cath, paraplegia ?-PCT 2.51, fever ? ?Plan: ?Patient received cefepime 2 gm IV x 1 in ED ?-Will order Cefepime 2 gm IV q8h for sepsis/UTI ? ? ? ?Height: 5\' 8"  (172.7 cm) ?Weight: 77.8 kg (171 lb 8.3 oz) ?IBW/kg (Calculated) : 68.4 ? ?Temp (24hrs), Avg:103 ?F (39.4 ?C), Min:103 ?F (39.4 ?C), Max:103 ?F (39.4 ?C) ? ?Recent Labs  ?Lab 07/12/21 ?1446  ?WBC 11.6*  ?CREATININE 0.40*  ?LATICACIDVEN 0.9  ?  ?Estimated Creatinine Clearance: 125.9 mL/min (A) (by C-G formula based on SCr of 0.4 mg/dL (L)).   ? ?No Known Allergies ? ?Antimicrobials this admission: ?cefepime 4/7 >>   ?  ?Dose adjustments this admission: ?  ? ?Microbiology results: ?4/7 BCx: pend ?4/7 UCx: pend  ?  Sputum:    ?  MRSA PCR:   ? ?Thank you for allowing pharmacy to be a part of this patient?s care. ? ?Daylah Sayavong A ?07/12/2021 5:29 PM ? ?

## 2021-07-12 NOTE — ED Provider Notes (Signed)
? ?[facility] ?Provider Note ? ? ? Event Date/Time  ? First MD Initiated Contact with Patient 07/12/21 1502   ?  (approximate) ? ? ?History  ? ?Urinary Tract Infection ? ? ?HPI ? ?[name] is a 36 y.o. 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  Also worried because he hit his left foot 6 days ago and has noticed some worsening swelling of the left lower extremity as well as some redness to the area.  Denies any abdominal pain.  No cough or congestion. ?  ? ? ?Physical Exam  ? ?Triage Vital Signs: ?ED Triage Vitals  ?Enc Vitals Group  ?   BP 07/12/21 1435 116/84  ?   Pulse Rate 07/12/21 1435 (!) 125  ?   Resp 07/12/21 1435 18  ?   Temp 07/12/21 1435 (!) 103 ?F (39.4 ?C)  ?   Temp Source 07/12/21 1435 Axillary  ?   SpO2 07/12/21 1435 100 %  ?   Weight 07/12/21 1436 171 lb 8.3 oz (77.8 kg)  ?   Height 07/12/21 1436 5\' 8"  (1.727 m)  ?   Head Circumference --   ?   Peak Flow --   ?   Pain Score --   ?   Pain Loc --   ?   Pain Edu? --   ?   Excl. in [place]? --   ? ? ?Most recent vital signs: ?Vitals:  ? 07/12/21 1530 07/12/21 1600  ?BP: 111/68 97/67  ?Pulse: 98 (!) 112  ?Resp: 12 13  ?Temp:    ?SpO2: 98% 100%  ? ? ? ?Constitutional: Alert  ?Eyes: Conjunctivae are normal.  ?Head: Atraumatic. ?Nose: No congestion/rhinnorhea. ?Mouth/Throat: Mucous membranes are moist.   ?Neck: Painless ROM.  ?Cardiovascular:   Good peripheral circulation. Mildly tachycardic, no m/g/r ?Respiratory: Normal respiratory effort.  No retractions. No crackles ?Gastrointestinal: Soft and nontender. Suprapubic catheter in place with dark urine in collection bag ?Musculoskeletal: Chronic contracted lower extremities with fair amount of swelling and ecchymosis around the left ankle with some chronic appearing ulcerations overlying warmth no crepitus no  blistering. ?Neurologic: no new focal deficits ?Skin:  Skin is warm, dry  ?Psychiatric: Mood and affect are normal. Speech and behavior are normal. ? ? ? ?ED Results / Procedures / Treatments  ? ?Labs ?(all labs ordered are listed, but only abnormal results are displayed) ?Labs Reviewed  ?COMPREHENSIVE METABOLIC PANEL - Abnormal; Notable for the following components:  ?    Result Value  ? Sodium 133 (*)   ? CO2 21 (*)   ? Creatinine, Ser 0.40 (*)   ? Calcium 8.6 (*)   ? Albumin 3.0 (*)   ? Total Bilirubin 2.0 (*)   ? All other components within normal limits  ?CBC WITH DIFFERENTIAL/PLATELET - Abnormal; Notable for the following components:  ? WBC 11.6 (*)   ? Hemoglobin 8.2 (*)   ? HCT 29.3 (*)   ? MCV 63.6 (*)   ? MCH 17.8 (*)   ? MCHC 28.0 (*)   ? RDW 19.4 (*)   ? Neutro Abs 9.9 (*)   ? Lymphs Abs 0.5 (*)   ? Monocytes Absolute 1.1 (*)   ? All other components within normal limits  ?URINALYSIS, COMPLETE (UACMP) WITH MICROSCOPIC - Abnormal; Notable for the following components:  ? Color, Urine  AMBER (*)   ? APPearance CLOUDY (*)   ? Hgb urine dipstick SMALL (*)   ? Ketones, ur 20 (*)   ? Protein, ur 100 (*)   ? Nitrite POSITIVE (*)   ? Leukocytes,Ua LARGE (*)   ? WBC, UA >50 (*)   ? Bacteria, UA RARE (*)   ? All other components within normal limits  ?CULTURE, BLOOD (ROUTINE X 2)  ?CULTURE, BLOOD (ROUTINE X 2)  ?URINE CULTURE  ?LACTIC ACID, PLASMA  ?PROTIME-INR  ?APTT  ?PROCALCITONIN  ?LACTIC ACID, PLASMA  ?PROCALCITONIN  ? ? ? ?EKG ? ?ED ECG REPORT ?I, Merlyn Lot, the attending physician, personally viewed and interpreted this ECG. ? ? Date: 07/12/2021 ? EKG Time: 14:34 ? Rate: 130 ? Rhythm: sinus ? Axis: normal ? Intervals:normal ? ST&T Change: no stemi, no depression ? ? ? ?RADIOLOGY ?Please see ED Course for my review and interpretation. ? ?I personally reviewed all radiographic images ordered to evaluate for the above acute complaints and reviewed radiology reports and findings.  These findings were  personally discussed with the patient.  Please see medical record for radiology report. ? ? ? ?PROCEDURES: ? ?Critical Care performed: Yes, see critical care procedure note(s) ? ?.Critical Care ?Performed by: Merlyn Lot, MD ?Authorized by: Merlyn Lot, MD  ? ?Critical care provider statement:  ?  Critical care time (minutes):  35 ?  Critical care was necessary to treat or prevent imminent or life-threatening deterioration of the following conditions:  Sepsis ?  Critical care was time spent personally by me on the following activities:  Ordering and performing treatments and interventions, ordering and review of laboratory studies, ordering and review of radiographic studies, pulse oximetry, re-evaluation of patient's condition, review of old charts, obtaining history from patient or surrogate, examination of patient, evaluation of patient's response to treatment, discussions with primary provider, discussions with consultants and development of treatment plan with patient or surrogate ?[facility]tho Injury Treatment ? ?Date/Time: 07/12/2021 5:07 PM ?Performed by: Merlyn Lot, MD ?Authorized by: Merlyn Lot, MD  ? ?Consent:  ?  Consent obtained:  Verbal ?  Consent given by:  Patient ?  Risks discussed:  FractureInjury location: lower leg ?Location details: left lower leg ?Injury type: fracture ?Fracture type: tibial and fibular shafts ?Manipulation performed: no ?Immobilization: splint ?Splint type: short leg and ankle stirrup ?Splint Applied by: ED Provider ?Supplies used: Ortho-Glass ?Post-procedure range of motion: unchanged ? ? ? ? ?MEDICATIONS ORDERED IN ED: ?Medications  ?lactated ringers infusion (has no administration in time range)  ?lactated ringers bolus 1,000 mL (0 mLs Intravenous Stopped 07/12/21 1706)  ?  And  ?lactated ringers bolus 1,000 mL (has no administration in time range)  ?  And  ?lactated ringers bolus 500 mL (has no administration in time range)  ?acetaminophen (TYLENOL) tablet  650 mg (has no administration in time range)  ?oxyCODONE-acetaminophen (PERCOCET/ROXICET) 5-325 MG per tablet 1 tablet (has no administration in time range)  ?morphine (PF) 2 MG/ML injection 2 mg (has no administration in time range)  ?methocarbamol (ROBAXIN) tablet 500 mg (has no administration in time range)  ?ondansetron (ZOFRAN) injection 4 mg (has no administration in time range)  ?ceFEPIme (MAXIPIME) 2 g in sodium chloride 0.9 % 100 mL IVPB (0 g Intravenous Stopped 07/12/21 1628)  ? ? ? ?IMPRESSION / MDM / ASSESSMENT AND PLAN / ED COURSE  ?I reviewed the triage vital signs and the nursing notes. ?             ?               ? ?  Differential diagnosis includes, but is not limited to, sepsis, UTI, pyelonephritis, stone, fracture, contusion, cellulitis, abscess ? ?Patient presented to the ER for evaluation of symptoms as described above.  Patient with vital signs concerning for sepsis complicated by indwelling Foley catheter with recent injury to his left leg where he hit his ramp with his wheelchair.  Does have significant swelling and warmth to the area possible cellulitis as he also has some chronic appearing ulcerations may be secondary to ecchymosis and contusion.  X-rays will be ordered.  Blood work sent for septic work-up. ? ? ?Clinical Course as of 07/12/21 1708  ?Fri Jul 12, 2021  ?1528 X-ray on my review and interpretation does not show any evidence of pneumothorax or consolidation. [PR]  ?1529 Urine does appear consistent with UTI.  Will order broad-spectrum antibiotics have ordered IV fluids given concern for sepsis. [PR]  ?1552 Lactate is normal.  Does have anemia with hemoglobin of 8 white count leukocytosis of 11,000. [PR]  ?1616 X-ray tib-fib on my review shows evidence of distal tib-fib fracture with diffuse severe osteopenia soft tissue swelling. [PR]  ?1644 Discussed x-ray evidence of tib-fib fracture with Dr. Mack Guise of orthopedics he does recommend posterior splint.  Given his will be at  baseline we will trial nonoperative management. [PR]  ?  ?Clinical Course User Index ?[PR] Merlyn Lot, MD  ? ? ? ?FINAL CLINICAL IMPRESSION(S) / ED DIAGNOSES  ? ?Final diagnoses:  ?Sepsis without acute organ dysfuncti
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
## 2021-07-12 NOTE — Progress Notes (Signed)
PHARMACY -  BRIEF ANTIBIOTIC NOTE  ? ?Pharmacy has received consult(s) for cefepime from an ED provider.  The patient's profile has been reviewed for ht/wt/allergies/indication/available labs.   ? ?One time order(s) placed by MD for cefepime ? ?Further antibiotics/pharmacy consults should be ordered by admitting physician if indicated.       ?                ?Thank you, ?Coreyon Nicotra A ?07/12/2021  3:27 PM ? ?

## 2021-07-12 NOTE — Progress Notes (Signed)
Elink following code sepis ?
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
## 2021-07-12 NOTE — Progress Notes (Signed)
CODE SEPSIS - PHARMACY COMMUNICATION ? ?**Broad Spectrum Antibiotics should be administered within 1 hour of Sepsis diagnosis** ? ?Time Code Sepsis Called/Page Received: 1525 ? ?Antibiotics Ordered: cefepime ? ?Time of 1st antibiotic administration: 1542 ? ?Additional action taken by pharmacy:   ? ?If necessary, Name of Provider/Nurse Contacted:   ? ? ? ?Angelique Blonder ,PharmD ?Clinical Pharmacist  ?07/12/2021  4:32 PM  ?

## 2021-07-13 DIAGNOSIS — E871 Hypo-osmolality and hyponatremia: Secondary | ICD-10-CM

## 2021-07-13 DIAGNOSIS — N390 Urinary tract infection, site not specified: Secondary | ICD-10-CM | POA: Diagnosis not present

## 2021-07-13 DIAGNOSIS — D500 Iron deficiency anemia secondary to blood loss (chronic): Secondary | ICD-10-CM

## 2021-07-13 DIAGNOSIS — I959 Hypotension, unspecified: Secondary | ICD-10-CM

## 2021-07-13 DIAGNOSIS — S82302A Unspecified fracture of lower end of left tibia, initial encounter for closed fracture: Secondary | ICD-10-CM | POA: Diagnosis not present

## 2021-07-13 DIAGNOSIS — A419 Sepsis, unspecified organism: Secondary | ICD-10-CM | POA: Diagnosis not present

## 2021-07-13 LAB — CBC
HCT: 23.9 % — ABNORMAL LOW (ref 39.0–52.0)
Hemoglobin: 6.7 g/dL — ABNORMAL LOW (ref 13.0–17.0)
MCH: 17.8 pg — ABNORMAL LOW (ref 26.0–34.0)
MCHC: 28 g/dL — ABNORMAL LOW (ref 30.0–36.0)
MCV: 63.4 fL — ABNORMAL LOW (ref 80.0–100.0)
Platelets: 267 10*3/uL (ref 150–400)
RBC: 3.77 MIL/uL — ABNORMAL LOW (ref 4.22–5.81)
RDW: 19.1 % — ABNORMAL HIGH (ref 11.5–15.5)
WBC: 8.3 10*3/uL (ref 4.0–10.5)
nRBC: 0 % (ref 0.0–0.2)

## 2021-07-13 LAB — HIV ANTIBODY (ROUTINE TESTING W REFLEX): HIV Screen 4th Generation wRfx: NONREACTIVE

## 2021-07-13 LAB — PROCALCITONIN: Procalcitonin: 1.87 ng/mL

## 2021-07-13 LAB — VITAMIN B12: Vitamin B-12: 242 pg/mL (ref 180–914)

## 2021-07-13 LAB — C-REACTIVE PROTEIN: CRP: 20 mg/dL — ABNORMAL HIGH (ref <1.0)

## 2021-07-13 LAB — PREPARE RBC (CROSSMATCH)

## 2021-07-13 LAB — ABO/RH: ABO/RH(D): AB POS

## 2021-07-13 MED ORDER — SODIUM CHLORIDE 0.9 % IV SOLN
2.0000 g | INTRAVENOUS | Status: DC
  Administered 2021-07-13 – 2021-07-16 (×4): 2 g via INTRAVENOUS
  Filled 2021-07-13 (×2): qty 20
  Filled 2021-07-13 (×3): qty 2

## 2021-07-13 MED ORDER — SODIUM CHLORIDE 0.9 % IV SOLN
125.0000 mg | Freq: Every day | INTRAVENOUS | Status: DC
  Administered 2021-07-13 – 2021-07-16 (×4): 125 mg via INTRAVENOUS
  Filled 2021-07-13 (×5): qty 10

## 2021-07-13 MED ORDER — GERHARDT'S BUTT CREAM
TOPICAL_CREAM | Freq: Three times a day (TID) | CUTANEOUS | Status: DC
  Filled 2021-07-13 (×2): qty 1

## 2021-07-13 MED ORDER — SODIUM CHLORIDE 0.9% IV SOLUTION: Freq: Once | INTRAVENOUS | Status: AC

## 2021-07-13 NOTE — Assessment & Plan Note (Signed)
--   Chronic.  Continue midodrine. ?

## 2021-07-13 NOTE — Assessment & Plan Note (Signed)
Evaluated by urology on admission, since his catheter adjustment placed 2 days prior to admission they recommended leaving it ?
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
## 2021-07-13 NOTE — Assessment & Plan Note (Addendum)
P/w fever, tachycardia.   ? ?Blood cultures growing E. coli.  Urine culture with E coli and PsA. ? ?Blood cultures have colonies of 2 morphologies. Micro will plate both for sensitivities, should be ready by tomorrow ?-Continue Rocephin day 5 ?- Consult ID ? ? ?

## 2021-07-13 NOTE — Assessment & Plan Note (Addendum)
-   Outpatient referral for Urology regarding prevention of recurrent UTI recommended ?

## 2021-07-13 NOTE — Consult Note (Signed)
WOC Nurse Consult Note: ?Reason for Consult:Chronic partial thickness skin lesion to right back. Seen by outpatient [facility] in the past and as recently as past month. Dermatological presentation that has responded to antifungals and steroids in the past. ?Wound type: autoimmune ?Pressure Injury POA: N/A ?Measurement: 25cm x 11cm x 0.1cm aT Last outpatient [facility]  visit with scattered areas in eruption. See also photos taken on admission in ED. ?Wound bed:red, moist ?Drainage (amount, consistency, odor) small ?Periwound: erythematous ?Dressing procedure/placement/frequency: I will provide patient with a mattress replacement with low air loss feature and provide guidance for Nursing in the turning and repositioning of the patient. Only therapeutic bed linens DermaTherapy) are to be with the patient, no plastic, or disposable briefs or pads).  ?Gerhart's Butt cream, a 1:1:1 compounded preparation of antifungal (lotrimin), hydrocortisone and zinc oxide is to be applied in a thin layer after cleansing. Heels are to be elevated and a sacral foam dressing applied for PI prevention. ? ?WOC nursing team will not follow, but will remain available to this patient, the nursing and medical teams.  Please re-consult if needed. ?Thanks, ?Ladona Mow, MSN, RN, GNP, CWOCN, CWON-AP, FAAN  ?Pager# [phone]  ? ? ? ?  ?

## 2021-07-13 NOTE — Assessment & Plan Note (Addendum)
Resolved with fluids °
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
## 2021-07-13 NOTE — Assessment & Plan Note (Addendum)
Pressure ulcer ruled out. Family believe this is moisture associated skin breakdown.  There is also some degree of inflammation which is unclear in nature, I do not think it is cellulitis. ?- Follow-up with dermatology as an outpatient  ?- Gerhart butt cream to the back ?

## 2021-07-13 NOTE — Assessment & Plan Note (Addendum)
Marked anemia, hemoglobin down to 6.7 on 4/8, no obvious clinical blood loss, but the iron saturation is less than 10% and is markedly microcytic hypoproliferation. Hgb electrophoresis normal. ? ?Transfused 1 unit 4/8 ?FOBT negative x1 ?- Finish IV iron today ?- Would recommend repeat FOBT as outpatient, if positive, recommend GI referral.  If negative, suspect the source is blood loss from his back dermatitis  ?

## 2021-07-13 NOTE — Progress Notes (Signed)
PHARMACY - PHYSICIAN COMMUNICATION ?CRITICAL VALUE ALERT - BLOOD CULTURE IDENTIFICATION (BCID) ? ? Initial BCID results: 1 (aerobic) of 4 w/ E coli, no resistance.  Pt ordered Ceftriaxone 2 gm q24h. ? ?Name of provider contacted: Cliffton Asters, NP ? ?Changes to prescribed antibiotics required: None ? ?Otelia Sergeant, PharmD, MBA ?07/13/2021 ?11:21 PM ? ? ?
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
## 2021-07-14 DIAGNOSIS — A4151 Sepsis due to Escherichia coli [E. coli]: Secondary | ICD-10-CM | POA: Diagnosis not present

## 2021-07-14 DIAGNOSIS — N390 Urinary tract infection, site not specified: Secondary | ICD-10-CM | POA: Diagnosis not present

## 2021-07-14 DIAGNOSIS — E876 Hypokalemia: Secondary | ICD-10-CM

## 2021-07-14 DIAGNOSIS — L308 Other specified dermatitis: Secondary | ICD-10-CM | POA: Diagnosis not present

## 2021-07-14 DIAGNOSIS — S82302A Unspecified fracture of lower end of left tibia, initial encounter for closed fracture: Secondary | ICD-10-CM | POA: Diagnosis not present

## 2021-07-14 LAB — BASIC METABOLIC PANEL WITH GFR
Anion gap: 8 (ref 5–15)
BUN: 6 mg/dL (ref 6–20)
CO2: 26 mmol/L (ref 22–32)
Calcium: 8.4 mg/dL — ABNORMAL LOW (ref 8.9–10.3)
Chloride: 101 mmol/L (ref 98–111)
Creatinine, Ser: 0.38 mg/dL — ABNORMAL LOW (ref 0.61–1.24)
GFR, Estimated: >60 mL/min
Glucose, Bld: 91 mg/dL (ref 70–99)
Potassium: 3 mmol/L — ABNORMAL LOW (ref 3.5–5.1)
Sodium: 135 mmol/L (ref 135–145)

## 2021-07-14 LAB — BLOOD CULTURE ID PANEL (REFLEXED) - BCID2

## 2021-07-14 LAB — BPAM RBC
Blood Product Expiration Date: 202305062359
ISSUE DATE / TIME: 202304081406
Unit Type and Rh: 6200

## 2021-07-14 LAB — CBC
HCT: 28.2 % — ABNORMAL LOW (ref 39.0–52.0)
Hemoglobin: 8 g/dL — ABNORMAL LOW (ref 13.0–17.0)
MCH: 18.5 pg — ABNORMAL LOW (ref 26.0–34.0)
MCHC: 28.4 g/dL — ABNORMAL LOW (ref 30.0–36.0)
MCV: 65.1 fL — ABNORMAL LOW (ref 80.0–100.0)
Platelets: 303 K/uL (ref 150–400)
RBC: 4.33 MIL/uL (ref 4.22–5.81)
RDW: 19.5 % — ABNORMAL HIGH (ref 11.5–15.5)
WBC: 8 K/uL (ref 4.0–10.5)
nRBC: 0 % (ref 0.0–0.2)

## 2021-07-14 LAB — TYPE AND SCREEN
ABO/RH(D): AB POS
Antibody Screen: NEGATIVE
Unit division: 0

## 2021-07-14 LAB — MAGNESIUM: Magnesium: 1.8 mg/dL (ref 1.7–2.4)

## 2021-07-14 MED ORDER — POTASSIUM CHLORIDE CRYS ER 20 MEQ PO TBCR
40.0000 meq | EXTENDED_RELEASE_TABLET | Freq: Two times a day (BID) | ORAL | Status: AC
  Administered 2021-07-14 (×2): 40 meq via ORAL
  Filled 2021-07-14 (×2): qty 2

## 2021-07-14 MED ORDER — ENOXAPARIN SODIUM 40 MG/0.4ML IJ SOSY
40.0000 mg | PREFILLED_SYRINGE | INTRAMUSCULAR | Status: DC
  Administered 2021-07-14 – 2021-07-17 (×4): 40 mg via SUBCUTANEOUS
  Filled 2021-07-14 (×4): qty 0.4
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
## 2021-07-14 NOTE — Progress Notes (Signed)
Have placed an order for Wound Care consult to re-assess, treat the wound, and give dressing change orders.  Foam dressing to the back peels skin off. Family states, dressing change done at home by wound care does not include placing foam but applying cream to the back and letting patient sleep on some thin sheets to avoid the peeling of the skin. ?

## 2021-07-14 NOTE — Assessment & Plan Note (Addendum)
Mag normal, K supplemented  ?

## 2021-07-15 DIAGNOSIS — S82302A Unspecified fracture of lower end of left tibia, initial encounter for closed fracture: Secondary | ICD-10-CM | POA: Diagnosis not present

## 2021-07-15 DIAGNOSIS — N390 Urinary tract infection, site not specified: Secondary | ICD-10-CM | POA: Diagnosis not present

## 2021-07-15 DIAGNOSIS — L308 Other specified dermatitis: Secondary | ICD-10-CM

## 2021-07-15 DIAGNOSIS — R7881 Bacteremia: Secondary | ICD-10-CM

## 2021-07-15 DIAGNOSIS — B9620 Unspecified Escherichia coli [E. coli] as the cause of diseases classified elsewhere: Secondary | ICD-10-CM

## 2021-07-15 DIAGNOSIS — S82832A Other fracture of upper and lower end of left fibula, initial encounter for closed fracture: Secondary | ICD-10-CM

## 2021-07-15 DIAGNOSIS — A4151 Sepsis due to Escherichia coli [E. coli]: Secondary | ICD-10-CM | POA: Diagnosis not present

## 2021-07-15 LAB — CBC
HCT: 28.5 % — ABNORMAL LOW (ref 39.0–52.0)
Hemoglobin: 8 g/dL — ABNORMAL LOW (ref 13.0–17.0)
MCH: 18.4 pg — ABNORMAL LOW (ref 26.0–34.0)
MCHC: 28.1 g/dL — ABNORMAL LOW (ref 30.0–36.0)
MCV: 65.7 fL — ABNORMAL LOW (ref 80.0–100.0)
Platelets: 321 10*3/uL (ref 150–400)
RBC: 4.34 MIL/uL (ref 4.22–5.81)
RDW: 19.7 % — ABNORMAL HIGH (ref 11.5–15.5)
WBC: 6.3 10*3/uL (ref 4.0–10.5)
nRBC: 0 % (ref 0.0–0.2)

## 2021-07-15 LAB — BASIC METABOLIC PANEL
Anion gap: 6 (ref 5–15)
BUN: 5 mg/dL — ABNORMAL LOW (ref 6–20)
CO2: 31 mmol/L (ref 22–32)
Calcium: 8.7 mg/dL — ABNORMAL LOW (ref 8.9–10.3)
Chloride: 104 mmol/L (ref 98–111)
Creatinine, Ser: <0.3 mg/dL — ABNORMAL LOW (ref 0.61–1.24)
Glucose, Bld: 103 mg/dL — ABNORMAL HIGH (ref 70–99)
Potassium: 4.2 mmol/L (ref 3.5–5.1)
Sodium: 141 mmol/L (ref 135–145)

## 2021-07-15 NOTE — Care Management Important Message (Signed)
Important Message ? ?Patient Details  ?Name: [name] ?MRN: [id number] ?Date of Birth: 1986/02/02 ? ? ?Medicare Important Message Given:  Yes ? ? ? ? ?[name] ?07/15/2021, 12:04 PM ?
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
## 2021-07-15 NOTE — TOC Initial Note (Addendum)
Transition of Care (TOC) - Initial/Assessment Note  ? ? ?Patient Details  ?Name: [name] ?MRN: [id number] ?Date of Birth: 1985-06-07 ? ?Transition of Care (TOC) CM/SW Contact:    ?Truddie Hidden, RN ?Phone Number: ?07/15/2021, 4:04 PM ? ?Clinical Narrative:                 ? ?Transition of Care (TOC) Screening Note ? ? ?Patient Details  ?Name: [name] ?Date of Birth: [birth year] ? ? ?Transition of Care (TOC) CM/SW Contact:    ?Truddie Hidden, RN ?Phone Number: ?07/15/2021, 4:14 PM ? ? ? ?Transition of Care Department [facility]) has reviewed patient and no TOC needs have been identified at this time. We will continue to monitor patient advancement through interdisciplinary progression rounds. If new patient transition needs arise, please place a TOC consult. ? ?Spoke with patient and mother at bedside. ?Family will be able to assist with care.  ?PCP: Unknown Foley ?Pharmacy:[facility] ?Current home health/prior home health/DME:No preference ? ?  ?  ? ? ?Patient Goals and CMS Choice ?  ?  ?  ? ?Expected Discharge Plan and Services ?  ?  ?  ?  ?  ?                ?  ?  ?  ?  ?  ?  ?  ?  ?  ?  ? ?Prior Living Arrangements/Services ?  ?  ?  ?       ?  ?  ?  ?  ? ?Activities of Daily Living ?Home Assistive Devices/Equipment: Nurse, adult, Wheelchair ?ADL Screening (condition at time of admission) ?Patient's cognitive ability adequate to safely complete daily activities?: Yes ?Is the patient deaf or have difficulty hearing?: No ?Does the patient have difficulty seeing, even when wearing glasses/contacts?: No ?Does the patient have difficulty concentrating, remembering, or making decisions?: No ?Patient able to express need for assistance with ADLs?: Yes ?Does the patient have difficulty dressing or bathing?: Yes ?Independently performs ADLs?: No ?Communication: Independent ?Dressing (OT): Needs assistance ?Is this a change from baseline?: Pre-admission baseline ?Grooming: Needs assistance ?Is this a change from baseline?:  Pre-admission baseline ?Feeding: Needs assistance ?Is this a change from baseline?: Pre-admission baseline ?Bathing: Needs assistance ?Is this a change from baseline?: Pre-admission baseline ?Toileting: Needs assistance ?Is this a change from baseline?: Pre-admission baseline ?In/Out Bed: Needs assistance ?Is this a change from baseline?: Pre-admission baseline ?Walks in Home: Dependent ?Is this a change from baseline?: Pre-admission baseline ?Does the patient have difficulty walking or climbing stairs?: Yes ?Weakness of Legs: Both ?Weakness of Arms/Hands: Both ? ?Permission Sought/Granted ?  ?  ?   ?   ?   ?   ? ?Emotional Assessment ?  ?  ?  ?  ?  ?  ? ?Admission diagnosis:  UTI (urinary tract infection) [N39.0] ?Acute cystitis without hematuria [N30.00] ?Complicated UTI (urinary tract infection) [N39.0] ?Sepsis without acute organ dysfunction, due to unspecified organism [facility]) [A41.9] ?Patient Active Problem List  ? Diagnosis Date Noted  ? Hypokalemia 07/14/2021  ? Hyponatremia 07/13/2021  ? Complicated UTI (urinary tract infection) 07/12/2021  ? Indwelling Foley catheter present 07/12/2021  ? Sepsis (HCC) 07/12/2021  ? Closed fracture of distal end of left fibula and tibia 07/12/2021  ? Iron deficiency anemia due to chronic blood loss 07/12/2021  ? Quadriplegia (HCC) 07/12/2021  ? GERD (gastroesophageal reflux disease) 07/12/2021  ? Dermatitis associated with moisture 07/12/2021  ? Hypotension 07/12/2021  ? ?PCP:  Gildardo Pounds, PA ?Pharmacy:   ?[facility] [phone] - [place], [place] - [address] AT [facility] OF [address] ?[address] ?[place] [address] ?Phone: [phone] Fax: [phone] ? ? ? ? ?Social Determinants of Health (SDOH) Interventions ?  ? ?Readmission Risk Interventions ?   ? View : No data to display.  ?  ?  ?  ? ? ? ?
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
## 2021-07-16 ENCOUNTER — Inpatient Hospital Stay: Payer: Medicare (Managed Care)

## 2021-07-16 DIAGNOSIS — N390 Urinary tract infection, site not specified: Secondary | ICD-10-CM | POA: Diagnosis not present

## 2021-07-16 DIAGNOSIS — L308 Other specified dermatitis: Secondary | ICD-10-CM | POA: Diagnosis not present

## 2021-07-16 DIAGNOSIS — S82302A Unspecified fracture of lower end of left tibia, initial encounter for closed fracture: Secondary | ICD-10-CM | POA: Diagnosis not present

## 2021-07-16 DIAGNOSIS — A4151 Sepsis due to Escherichia coli [E. coli]: Secondary | ICD-10-CM | POA: Diagnosis not present

## 2021-07-16 LAB — BASIC METABOLIC PANEL
Anion gap: 8 (ref 5–15)
BUN: 7 mg/dL (ref 6–20)
CO2: 29 mmol/L (ref 22–32)
Calcium: 8.9 mg/dL (ref 8.9–10.3)
Chloride: 102 mmol/L (ref 98–111)
Creatinine, Ser: 0.4 mg/dL — ABNORMAL LOW (ref 0.61–1.24)
GFR, Estimated: >60 mL/min (ref >60)
Glucose, Bld: 94 mg/dL (ref 70–99)
Potassium: 3.8 mmol/L (ref 3.5–5.1)
Sodium: 139 mmol/L (ref 135–145)

## 2021-07-16 LAB — URINE CULTURE: Culture: >=100000 — AB

## 2021-07-16 LAB — CBC
HCT: 31.2 % — ABNORMAL LOW (ref 39.0–52.0)
Hemoglobin: 8.7 g/dL — ABNORMAL LOW (ref 13.0–17.0)
MCH: 18.8 pg — ABNORMAL LOW (ref 26.0–34.0)
MCHC: 27.9 g/dL — ABNORMAL LOW (ref 30.0–36.0)
MCV: 67.5 fL — ABNORMAL LOW (ref 80.0–100.0)
Platelets: 252 10*3/uL (ref 150–400)
RBC: 4.62 MIL/uL (ref 4.22–5.81)
RDW: 20.4 % — ABNORMAL HIGH (ref 11.5–15.5)
WBC: 6.2 10*3/uL (ref 4.0–10.5)
nRBC: 0.6 % — ABNORMAL HIGH (ref 0.0–0.2)

## 2021-07-16 LAB — HGB FRACTIONATION CASCADE
Hgb A2: 1.9 % (ref 1.8–3.2)
Hgb A: 98.1 % (ref 96.4–98.8)
Hgb F: 0 % (ref 0.0–2.0)
Hgb S: 0 %

## 2021-07-16 LAB — OCCULT BLOOD X 1 CARD TO LAB, STOOL: Fecal Occult Bld: NEGATIVE
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
## 2021-07-16 NOTE — Progress Notes (Signed)
Mobility Specialist - Progress Note ? ? 07/16/21 1300  ?Mobility  ?Activity Turned to right side  ?$Mobility charge 1 Mobility  ? ? ? ?Pt turned to R side for pillow-wedge support and peri-care d/t BM. Family at bedside.  ? ? ?[name] ?Mobility Specialist ?07/16/21, 1:54 PM ? ? ? ? ?

## 2021-07-16 NOTE — TOC Progression Note (Addendum)
Transition of Care (TOC) - Progression Note  ? ? ?Patient Details  ?Name: [name] ?MRN: [id number] ?Date of Birth: September 22, 1985 ? ?Transition of Care (TOC) CM/SW Contact  ?Hetty Ely, RN ?Phone Number: ?07/16/2021, 4:27 PM ? ?Clinical Narrative: Attempted to call mother to discuss discharge needs, no answer left voice message to return call. ? ? ? ?  ?  ? ?Expected Discharge Plan and Services ?  ?  ?  ?  ?  ?                ?  ?  ?  ?  ?  ?  ?  ?  ?  ?  ? ? ?Social Determinants of Health (SDOH) Interventions ?  ? ?Readmission Risk Interventions ?   ? View : No data to display.  ?  ?  ?  ? ? ?

## 2021-07-17 DIAGNOSIS — L308 Other specified dermatitis: Secondary | ICD-10-CM | POA: Diagnosis not present

## 2021-07-17 DIAGNOSIS — S82832K Other fracture of upper and lower end of left fibula, subsequent encounter for closed fracture with nonunion: Secondary | ICD-10-CM

## 2021-07-17 DIAGNOSIS — A4151 Sepsis due to Escherichia coli [E. coli]: Secondary | ICD-10-CM | POA: Diagnosis not present

## 2021-07-17 DIAGNOSIS — S82302K Unspecified fracture of lower end of left tibia, subsequent encounter for closed fracture with nonunion: Secondary | ICD-10-CM | POA: Diagnosis not present

## 2021-07-17 DIAGNOSIS — N390 Urinary tract infection, site not specified: Secondary | ICD-10-CM | POA: Diagnosis not present

## 2021-07-17 LAB — CULTURE, BLOOD (ROUTINE X 2)
Culture: NO GROWTH
Special Requests: ADEQUATE

## 2021-07-17 MED ORDER — METHOCARBAMOL 500 MG PO TABS: 500.0000 mg | ORAL_TABLET | Freq: Three times a day (TID) | ORAL | 0 refills | Status: DC | PRN

## 2021-07-17 MED ORDER — METHOCARBAMOL 500 MG PO TABS: 500.0000 mg | ORAL_TABLET | Freq: Three times a day (TID) | ORAL | 0 refills | Status: AC | PRN

## 2021-07-17 MED ORDER — ACETAMINOPHEN 325 MG PO TABS: 650.0000 mg | ORAL_TABLET | Freq: Four times a day (QID) | ORAL | Status: DC | PRN

## 2021-07-17 MED ORDER — GERHARDT'S BUTT CREAM: 1.0000 "application " | TOPICAL_CREAM | Freq: Three times a day (TID) | CUTANEOUS | 0 refills | Status: AC

## 2021-07-17 MED ORDER — GERHARDT'S BUTT CREAM: 1.0000 "application " | TOPICAL_CREAM | Freq: Three times a day (TID) | CUTANEOUS | 0 refills | Status: DC

## 2021-07-17 MED ORDER — CIPROFLOXACIN HCL 500 MG PO TABS: 500.0000 mg | ORAL_TABLET | Freq: Two times a day (BID) | ORAL | 0 refills | Status: DC

## 2021-07-17 MED ORDER — OXYCODONE-ACETAMINOPHEN 5-325 MG PO TABS: 1.0000 | ORAL_TABLET | Freq: Four times a day (QID) | ORAL | 0 refills | Status: AC | PRN

## 2021-07-17 MED ORDER — CIPROFLOXACIN HCL 500 MG PO TABS
500.0000 mg | ORAL_TABLET | Freq: Two times a day (BID) | ORAL | Status: DC
Start: 2021-07-17 — End: 2021-07-17
  Administered 2021-07-17: 500 mg via ORAL
  Filled 2021-07-17: qty 1

## 2021-07-17 MED ORDER — CIPROFLOXACIN HCL 500 MG PO TABS: 500.0000 mg | ORAL_TABLET | Freq: Two times a day (BID) | ORAL | 0 refills | Status: AC

## 2021-07-17 MED ORDER — OXYCODONE-ACETAMINOPHEN 5-325 MG PO TABS: 1.0000 | ORAL_TABLET | Freq: Four times a day (QID) | ORAL | 0 refills | Status: DC | PRN

## 2021-07-17 MED ORDER — ACETAMINOPHEN 325 MG PO TABS
650.0000 mg | ORAL_TABLET | Freq: Four times a day (QID) | ORAL | Status: AC | PRN
Start: 2021-07-17 — End: ?

## 2021-07-17 NOTE — TOC Transition Note (Signed)
Transition of Care (TOC) - CM/SW Discharge Note ? ? ?Patient Details  ?Name: [name] ?MRN: [id number] ?Date of Birth: 04-Jun-1985 ? ?Transition of Care (TOC) CM/SW Contact:  ?Margarito Liner, LCSW ?Phone Number: ?07/17/2021, 4:31 PM ? ? ?Clinical Narrative:   Patient has orders to discharge home today. TOC coworker arranged EMS transport and he is 7th on the list. Mom was with him. No further concerns. CSW signing off. ? ?Final next level of care: Home/Self Care ?Barriers to Discharge: Barriers Resolved ? ? ?Patient Goals and CMS Choice ?  ?  ?  ? ?Discharge Placement ?  ?           ?  ?Patient to be transferred to facility by: EMS ?Name of family member notified: Deirdre Priest ?Patient and family notified of of transfer: 07/17/21 ? ?Discharge Plan and Services ?  ?  ?           ?  ?  ?  ?  ?  ?  ?  ?  ?  ?  ? ?Social Determinants of Health (SDOH) Interventions ?  ? ? ?Readmission Risk Interventions ?   ? View : No data to display.  ?  ?  ?  ? ? ? ? ? ?
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
## 2021-07-17 NOTE — Progress Notes (Signed)
[name] to be D/C'd Home per MD order.  Discussed prescriptions and follow up appointments with the patient. Prescriptions given to patient, medication list explained in detail. Pt verbalized understanding. ? ?Allergies as of 07/17/2021   ?No Known Allergies ?  ? ?  ?Medication List  ?  ? ?TAKE these medications   ? ?acetaminophen 325 MG tablet ?Commonly known as: TYLENOL ?Take 2 tablets (650 mg total) by mouth every 6 (six) hours as needed for fever. ?  ?ciprofloxacin 500 MG tablet ?Commonly known as: CIPRO ?Take 1 tablet (500 mg total) by mouth 2 (two) times daily for 9 days. ?  ?Gerhardt's butt cream Crea ?Apply 1 application. topically 3 (three) times daily. ?  ?methocarbamol 500 MG tablet ?Commonly known as: ROBAXIN ?Take 1 tablet (500 mg total) by mouth every 8 (eight) hours as needed for up to 5 days for muscle spasms. ?  ?midodrine 10 MG tablet ?Commonly known as: PROAMATINE ?Take 10 mg by mouth daily. ?  ?omeprazole 20 MG capsule ?Commonly known as: PRILOSEC ?Take 20 mg by mouth daily as needed (acid reflux symptoms). ?  ?oxyCODONE-acetaminophen 5-325 MG tablet ?Commonly known as: PERCOCET/ROXICET ?Take 1 tablet by mouth every 6 (six) hours as needed for up to 3 days for moderate pain. ?  ?triamcinolone cream 0.1 % ?Commonly known as: KENALOG ?Apply 1 application. topically daily. (Apply thin layer to back wound) ?  ? ?  ? ?  ?  ? ? ?  ?Discharge Care Instructions  ?(From admission, onward)  ?  ? ? ?  ? ?  Start     Ordered  ? 07/17/21 0000  Discharge wound care:       ?Comments: As above  ? 07/17/21 1136  ? ?  ?  ? ?  ? ? ?Vitals:  ? 07/17/21 0345 07/17/21 0757  ?BP: 129/84 98/68  ?Pulse: 66 97  ?Resp: 16 18  ?Temp: (!) 97.4 ?F (36.3 ?C) 97.9 ?F (36.6 ?C)  ?SpO2: 98% 98%  ? ? ?Skin clean, dry and intact without evidence of skin break down, no evidence of skin tears noted. IV catheter discontinued intact. Site without signs and symptoms of complications. Dressing and pressure applied. Pt denies pain at  this time. No complaints noted. ? ?An After Visit Summary was printed and given to the patient. ?Patient escorted via stretcher, and D/C home via EMS. ? ?Rolley Sims  ?

## 2021-07-19 DIAGNOSIS — S8292XA Unspecified fracture of left lower leg, initial encounter for closed fracture: Secondary | ICD-10-CM | POA: Diagnosis not present

## 2021-07-25 DIAGNOSIS — S8292XA Unspecified fracture of left lower leg, initial encounter for closed fracture: Secondary | ICD-10-CM | POA: Diagnosis not present

## 2021-10-10 ENCOUNTER — Ambulatory Visit: Payer: Medicare (Managed Care) | Admitting: Physician Assistant

## 2021-10-18 ENCOUNTER — Ambulatory Visit: Payer: Self-pay | Admitting: Physician Assistant

## 2021-10-30 ENCOUNTER — Encounter: Payer: Medicare Other | Attending: Internal Medicine | Admitting: Internal Medicine

## 2021-10-30 DIAGNOSIS — L98422 Non-pressure chronic ulcer of back with fat layer exposed: Secondary | ICD-10-CM | POA: Insufficient documentation

## 2021-10-30 DIAGNOSIS — G8222 Paraplegia, incomplete: Secondary | ICD-10-CM | POA: Diagnosis not present

## 2021-10-30 DIAGNOSIS — L97829 Non-pressure chronic ulcer of other part of left lower leg with unspecified severity: Secondary | ICD-10-CM | POA: Insufficient documentation

## 2021-10-30 DIAGNOSIS — L988 Other specified disorders of the skin and subcutaneous tissue: Secondary | ICD-10-CM | POA: Insufficient documentation

## 2021-10-30 DIAGNOSIS — B354 Tinea corporis: Secondary | ICD-10-CM | POA: Diagnosis not present

## 2021-10-31 NOTE — Progress Notes (Signed)
Robert SpringDEESE, [name] ([id number]) Visit Report for 10/30/2021 Allergy List Details Patient Name: Robert SpringDEESE, [name] Date of Service: 10/30/2021 12:45 PM Medical Record Number: [id number] Patient Account Number: [id number] Date of Birth/Sex: April 16, 1985 (36 y.o. Male) Treating RN: Yevonne PaxEpps, Carrie Primary Care Muna Demers: Unknown FoleyWhitten, Robin Other Clinician: Referring Jahnai Slingerland: Juanell FairlyKrasinski, Kevin Treating Jeramine Delis/Extender: Tilda FrancoHoffman, Jessica Weeks in Treatment: 0 Allergies Active Allergies No Known Allergies Allergy Notes Electronic Signature(s) Signed: 10/31/2021 4:52:51 PM By: Yevonne PaxEpps, Carrie RN Entered By: Yevonne PaxEpps, Carrie on 10/30/2021 12:43:25 Robert SpringEESE, Mckennon ([id number]) -------------------------------------------------------------------------------- Arrival Information Details Patient Name: Robert SpringEESE, [name] Date of Service: 10/30/2021 12:45 PM Medical Record Number: [id number] Patient Account Number: [id number] Date of Birth/Sex: April 16, 1985 (36 y.o. Male) Treating RN: Yevonne PaxEpps, Carrie Primary Care Anise Harbin: Unknown FoleyWhitten, Robin Other Clinician: Referring Damondre Pfeifle: Juanell FairlyKrasinski, Kevin Treating Yarlin Breisch/Extender: Tilda FrancoHoffman, Jessica Weeks in Treatment: 0 Visit Information Patient Arrived: Wheel Chair Arrival Time: 12:37 Accompanied By: caregiver Transfer Assistance: None Patient Identification Verified: Yes Secondary Verification Process Completed: Yes Patient Requires Transmission-Based Precautions: No Patient Has Alerts: No History Since Last Visit All ordered tests and consults were completed: No Added or deleted any medications: No Any new allergies or adverse reactions: No Had a fall or experienced change in activities of daily living that may affect risk of falls: No Signs or symptoms of abuse/neglect since last visito No Hospitalized since last visit: No Implantable device outside of the clinic excluding cellular tissue based products placed in the center since last visit: No Has Dressing in Place as Prescribed:  Yes Electronic Signature(s) Signed: 10/31/2021 4:52:51 PM By: Yevonne PaxEpps, Carrie RN Entered By: Yevonne PaxEpps, Carrie on 10/30/2021 12:41:40 Robert SpringEESE, [name] ([id number]) -------------------------------------------------------------------------------- Clinic Level of Care Assessment Details Patient Name: Robert SpringEESE, [name] Date of Service: 10/30/2021 12:45 PM Medical Record Number: [id number] Patient Account Number: [id number] Date of Birth/Sex: April 16, 1985 (36 y.o. Male) Treating RN: Yevonne PaxEpps, Carrie Primary Care Liana Camerer: Unknown FoleyWhitten, Robin Other Clinician: Referring Teri Legacy: Juanell FairlyKrasinski, Kevin Treating Carsin Randazzo/Extender: Tilda FrancoHoffman, Jessica Weeks in Treatment: 0 Clinic Level of Care Assessment Items TOOL 2 Quantity Score X - Use when only an EandM is performed on the INITIAL visit 1 0 ASSESSMENTS - Nursing Assessment / Reassessment X - General Physical Exam (combine w/ comprehensive assessment (listed just below) when performed on new 1 20 pt. evals) X- 1 25 Comprehensive Assessment (HX, ROS, Risk Assessments, Wounds Hx, etc.) ASSESSMENTS - Wound and Skin Assessment / Reassessment []  - Simple Wound Assessment / Reassessment - one wound 0 X- 4 5 Complex Wound Assessment / Reassessment - multiple wounds []  - 0 Dermatologic / Skin Assessment (not related to wound area) ASSESSMENTS - Ostomy and/or Continence Assessment and Care []  - Incontinence Assessment and Management 0 []  - 0 Ostomy Care Assessment and Management (repouching, etc.) PROCESS - Coordination of Care []  - Simple Patient / Family Education for ongoing care 0 X- 1 20 Complex (extensive) Patient / Family Education for ongoing care []  - 0 Staff obtains ChiropractorConsents, Records, Test Results / Process Orders []  - 0 Staff telephones HHA, Nursing Homes / Clarify orders / etc []  - 0 Routine Transfer to another Facility (non-emergent condition) []  - 0 Routine Hospital Admission (non-emergent condition) []  - 0 New Admissions / Manufacturing engine[employer]surance Authorizations /  Ordering NPWT, Apligraf, etc. []  - 0 Emergency Hospital Admission (emergent condition) X- 1 10 Simple Discharge Coordination []  - 0 Complex (extensive) Discharge Coordination PROCESS - Special Needs []  - Pediatric / Minor Patient Management 0 []  - 0 Isolation Patient Management []  - 0 Hearing / Language / Visual special needs []  - 0 Assessment of Community  assistance (transportation, D/C planning, etc.) []  - 0 Additional assistance / Altered mentation []  - 0 Support Surface(s) Assessment (bed, cushion, seat, etc.) INTERVENTIONS - Wound Cleansing / Measurement X - Wound Imaging (photographs - any number of wounds) 1 5 []  - 0 Wound Tracing (instead of photographs) []  - 0 Simple Wound Measurement - one wound X- 4 5 Complex Wound Measurement - multiple wounds Baus, Idrissa ( ) []  - 0 Simple Wound Cleansing - one wound X- 4 5 Complex Wound Cleansing - multiple wounds INTERVENTIONS - Wound Dressings X - Small Wound Dressing one or multiple wounds 1 10 X- 1 15 Medium Wound Dressing one or multiple wounds X- 2 20 Large Wound Dressing one or multiple wounds []  - 0 Application of Medications - injection INTERVENTIONS - Miscellaneous []  - External ear exam 0 []  - 0 Specimen Collection (cultures, biopsies, blood, body fluids, etc.) []  - 0 Specimen(s) / Culture(s) sent or taken to Lab for analysis []  - 0 Patient Transfer (multiple staff / / Similar devices) []  - 0 Simple Staple / Suture removal (25 or less) []  - 0 Complex Staple / Suture removal (26 or more) []  - 0 Hypo / Hyperglycemic Management (close monitor of Blood Glucose) []  - 0 Ankle / Brachial Index (ABI) - do not check if billed separately Has the patient been seen at the hospital within the last three years: Yes Total Score: 205 Level Of Care: New/Established - Level 5 Electronic Signature(s) Signed: 10/31/2021 4:52:51 PM By: RN Entered By: [id number] on 10/30/2021  13:48:43 ( ) -------------------------------------------------------------------------------- Encounter Discharge Information Details Patient Name: Date of Service: 10/30/2021 12:45 PM Medical Record Number: Patient Account Number: Nurse, adult Date of Birth/Sex: [birth year] (36 y.o. Male) Treating RN: Primary Care Sevannah Madia: Other Clinician: Referring Olyvia Gopal: 11/02/2021 Treating Byren Pankow/Extender: Yevonne Pax in Treatment: 0 Encounter Discharge Information Items Discharge Condition: Stable Ambulatory Status: Wheelchair Discharge Destination: Home Transportation: Private Auto Accompanied By: caregiver Schedule Follow-up Appointment: Yes Clinical Summary of Care: Electronic Signature(s) Signed: 10/30/2021 1:49:31 PM By: 11/01/2021 RN Entered By: [name] on 10/30/2021 13:49:31 [name] (11/01/2021) -------------------------------------------------------------------------------- Lower Extremity Assessment Details Patient Name: [id number] Date of Service: 10/30/2021 12:45 PM Medical Record Number: 02/24/1986 Patient Account Number: [id number] Date of Birth/Sex: 09-Jun-1985 (36 y.o. Male) Treating RN: Juanell Fairly Primary Care Glendene Wyer: Tilda Franco Other Clinician: Referring Mischa Pollard: 11/01/2021 Treating Deah Ottaway/Extender: Yevonne Pax in Treatment: 0 Edema Assessment Assessed: [Left: No] [Right: No] Edema: [Left: N] [Right: o] Calf Left: Right: Point of Measurement: From Medial Instep 25 cm Ankle Left: Right: Point of Measurement: From Medial Instep 23 cm Vascular Assessment Pulses: Dorsalis Pedis Palpable: [Left:Yes] Electronic Signature(s) Signed: 10/30/2021 1:09:49 PM By: 11/01/2021 RN Entered By: [name] on 10/30/2021 13:09:49 [name] (11/01/2021) -------------------------------------------------------------------------------- Multi  Wound Chart Details Patient Name: [id number] Date of Service: 10/30/2021 12:45 PM Medical Record Number: 02/24/1986 Patient Account Number: [id number] Date of Birth/Sex: 07/23/1985 (36 y.o. Male) Treating RN: Juanell Fairly Primary Care Jadynn Epping: Tilda Franco Other Clinician: Referring Simran Bomkamp: 11/01/2021 Treating Savier Trickett/Extender: Yevonne Pax in Treatment: 0 Vital Signs Height(in): 73 Pulse(bpm): 111 Weight(lbs): 185 Blood Pressure(mmHg): 110/77 Body Mass Index(BMI): 24.4 Temperature(F): 98 Respiratory Rate(breaths/min): 18 Photos: Wound Location: Left Upper Leg Left, Circumferential Lower Leg Anterior Foot Wounding Event: Other Lesion Gradually Appeared Gradually Appeared Primary Etiology: Atypical Atypical Atypical Comorbid History: Quadriplegia Quadriplegia Quadriplegia Date Acquired: 08/05/2021 08/05/2021 07/06/2021 Weeks of Treatment: 0 0 0  Wound Status: Open Open Open Wound Recurrence: No No No Measurements L x W x D (cm) 16.5x9x0.1 27x24x0.1 5x6x0.1 Area (cm) : 161.096 508.938 23.562 Volume (cm) : 11.663 50.894 2.356 Classification: Full Thickness Without Exposed Full Thickness Without Exposed Full Thickness Without Exposed Support Structures Support Structures Support Structures Exudate Amount: Medium Medium Medium Exudate Type: Sanguinous Sanguinous Sanguinous Exudate Color: red red red Granulation Amount: Large (67-100%) Large (67-100%) Small (1-33%) Granulation Quality: Hyper-granulation, Friable Red, Hyper-granulation, Friable Red, Hyper-granulation, Friable Necrotic Amount: None Present (0%) N/A Large (67-100%) Exposed Structures: Fat Layer (Subcutaneous Tissue): Fat Layer (Subcutaneous Tissue): Fat Layer (Subcutaneous Tissue): Yes Yes Yes Fascia: No Fascia: No Fascia: No Tendon: No Tendon: No Tendon: No Muscle: No Muscle: No Muscle: No Joint: No Joint: No Joint: No Bone: No Bone: No Bone: No Epithelialization: None None  None Wound Number: 6 N/A N/A Photos: N/A N/A Wound Location: Back N/A N/A Wounding Event: Gradually Appeared N/A N/A Primary Etiology: Atypical N/A N/A TEANDRE, HAMRE ([id number]) Comorbid History: Quadriplegia N/A N/A Date Acquired: 07/06/2021 N/A N/A Weeks of Treatment: 0 N/A N/A Wound Status: Open N/A N/A Wound Recurrence: No N/A N/A Measurements L x W x D (cm) 33x30x0.1 N/A N/A Area (cm) : 777.544 N/A N/A Volume (cm) : 77.754 N/A N/A Classification: Full Thickness Without Exposed N/A N/A Support Structures Exudate Amount: Medium N/A N/A Exudate Type: Sanguinous N/A N/A Exudate Color: red N/A N/A Granulation Amount: Large (67-100%) N/A N/A Granulation Quality: Red, Hyper-granulation, Friable N/A N/A Necrotic Amount: None Present (0%) N/A N/A Exposed Structures: Fat Layer (Subcutaneous Tissue): N/A N/A Yes Fascia: No Tendon: No Muscle: No Joint: No Bone: No Epithelialization: None N/A N/A Treatment Notes Electronic Signature(s) Signed: 10/30/2021 1:46:17 PM By: Geralyn Corwin DO Entered By: Geralyn Corwin on 10/30/2021 13:26:12 [name] ([id number]) -------------------------------------------------------------------------------- Multi-Disciplinary Care Plan Details Patient Name: [name] Date of Service: 10/30/2021 12:45 PM Medical Record Number: [id number] Patient Account Number: [id number] Date of Birth/Sex: Mar 22, 1986 (36 y.o. Male) Treating RN: Yevonne Pax Primary Care Annaleise Burger: Unknown Foley Other Clinician: Referring Birtie Fellman: Juanell Fairly Treating Derius Ghosh/Extender: Tilda Franco in Treatment: 0 Active Inactive Wound/Skin Impairment Nursing Diagnoses: Knowledge deficit related to ulceration/compromised skin integrity Goals: Patient/caregiver will verbalize understanding of skin care regimen Date Initiated: 10/30/2021 Target Resolution Date: 11/30/2021 Goal Status: Active Ulcer/skin breakdown will have a volume reduction of 30% by  week 4 Date Initiated: 10/30/2021 Target Resolution Date: 11/30/2021 Goal Status: Active Ulcer/skin breakdown will have a volume reduction of 50% by week 8 Date Initiated: 10/30/2021 Target Resolution Date: 12/31/2021 Goal Status: Active Ulcer/skin breakdown will have a volume reduction of 80% by week 12 Date Initiated: 10/30/2021 Target Resolution Date: 01/14/2022 Goal Status: Active Ulcer/skin breakdown will heal within 14 weeks Date Initiated: 10/30/2021 Target Resolution Date: 03/02/2022 Goal Status: Active Interventions: Assess patient/caregiver ability to obtain necessary supplies Assess patient/caregiver ability to perform ulcer/skin care regimen upon admission and as needed Assess ulceration(s) every visit Notes: Electronic Signature(s) Signed: 10/31/2021 4:52:51 PM By: Yevonne Pax RN Entered By: Yevonne Pax on 10/30/2021 13:21:14 [name] ([id number]) -------------------------------------------------------------------------------- Pain Assessment Details Patient Name: [name] Date of Service: 10/30/2021 12:45 PM Medical Record Number: [id number] Patient Account Number: [id number] Date of Birth/Sex: [birth year] (36 y.o. Male) Treating RN: Yevonne Pax Primary Care Reinhart Saulters: Unknown Foley Other Clinician: Referring Tanyiah Laurich: Juanell Fairly Treating Samira Acero/Extender: Tilda Franco in Treatment: 0 Active Problems Location of Pain Severity and Description of Pain Patient Has Paino Yes Site Locations With Dressing Change: Yes Duration of the Pain. Constant /  Intermittento Intermittent How Long Does it Lasto Hours: Minutes: 15 Rate the pain. Current Pain Level: 0 Worst Pain Level: 4 Least Pain Level: 0 Tolerable Pain Level: 5 Character of Pain Describe the Pain: Burning Pain Management and Medication Current Pain Management: Medication: Yes Cold Application: No Rest: Yes Massage: No Activity: No T.E.N.S.: No Heat Application: No Leg drop  or elevation: No Is the Current Pain Management Adequate: Inadequate How does your wound impact your activities of daily livingo Sleep: No Bathing: No Appetite: No Relationship With Others: No Bladder Continence: No Emotions: No Bowel Continence: No Work: No Toileting: No Drive: No Dressing: No Hobbies: No Electronic Signature(s) Signed: 10/31/2021 4:52:51 PM By: Yevonne Pax RN Entered By: Yevonne Pax on 10/30/2021 12:42:31 [name] ([id number]) -------------------------------------------------------------------------------- Patient/Caregiver Education Details Patient Name: [name] Date of Service: 10/30/2021 12:45 PM Medical Record Number: [id number] Patient Account Number: [id number] Date of Birth/Gender: Apr 28, 1985 (36 y.o. Male) Treating RN: Yevonne Pax Primary Care Physician: Unknown Foley Other Clinician: Referring Physician: Juanell Fairly Treating Physician/Extender: Tilda Franco in Treatment: 0 Education Assessment Education Provided To: Patient Education Topics Provided Wound/Skin Impairment: Methods: Explain/Verbal Responses: State content correctly Electronic Signature(s) Signed: 10/31/2021 4:52:51 PM By: Yevonne Pax RN Entered By: Yevonne Pax on 10/30/2021 13:45:58 [name] ([id number]) -------------------------------------------------------------------------------- Wound Assessment Details Patient Name: [name] Date of Service: 10/30/2021 12:45 PM Medical Record Number: [id number] Patient Account Number: [id number] Date of Birth/Sex: June 05, 1985 (36 y.o. Male) Treating RN: Yevonne Pax Primary Care Debby Clyne: Unknown Foley Other Clinician: Referring Kyley Laurel: Juanell Fairly Treating Jahnyla Parrillo/Extender: Tilda Franco in Treatment: 0 Wound Status Wound Number: 3 Primary Etiology: Atypical Wound Location: Left Upper Leg Wound Status: Open Wounding Event: Other Lesion Comorbid History: Quadriplegia Date  Acquired: 08/05/2021 Weeks Of Treatment: 0 Clustered Wound: No Photos Wound Measurements Length: (cm) 16.5 Width: (cm) 9 Depth: (cm) 0.1 Area: (cm) 116.632 Volume: (cm) 11.663 % Reduction in Area: % Reduction in Volume: Epithelialization: None Tunneling: No Undermining: No Wound Description Classification: Full Thickness Without Exposed Support Structures Exudate Amount: Medium Exudate Type: Sanguinous Exudate Color: red Foul Odor After Cleansing: No Slough/Fibrino No Wound Bed Granulation Amount: Large (67-100%) Exposed Structure Granulation Quality: Hyper-granulation, Friable Fascia Exposed: No Necrotic Amount: None Present (0%) Fat Layer (Subcutaneous Tissue) Exposed: Yes Tendon Exposed: No Muscle Exposed: No Joint Exposed: No Bone Exposed: No Treatment Notes Wound #3 (Upper Leg) Wound Laterality: Left Cleanser Wound Cleanser Discharge Instruction: Wash your hands with soap and water. Remove old dressing, discard into plastic bag and place into trash. Cleanse the wound with Wound Cleanser prior to applying a clean dressing using gauze sponges, not tissues or cotton balls. Do not scrub or use excessive force. Pat dry using gauze sponges, not tissue or cotton balls. BOOKERT, GUZZI ([id number]) Peri-Wound Care Topical Triamcinolone Acetonide Cream, 0.1%, 15 (g) tube Discharge Instruction: Apply as directed by Kaelene Elliston. Nystatin Cream, 30 (g) tube Discharge Instruction: in office spray at home Primary Dressing Secondary Dressing ABD Pad 5x9 (in/in) Discharge Instruction: Cover with ABD pad Secured With Medipore Tape - 51M Medipore H Soft Cloth Surgical Tape, 2x2 (in/yd) Compression Wrap Compression Stockings Add-Ons Electronic Signature(s) Signed: 10/30/2021 1:05:54 PM By: Yevonne Pax RN Entered By: Yevonne Pax on 10/30/2021 13:05:54 [name] ([id number]) -------------------------------------------------------------------------------- Wound Assessment  Details Patient Name: [name] Date of Service: 10/30/2021 12:45 PM Medical Record Number: [id number] Patient Account Number: [id number] Date of Birth/Sex: 15-Feb-1986 (36 y.o. Male) Treating RN: Yevonne Pax Primary Care Parlee Amescua: Unknown Foley Other Clinician: Referring Nilam Quakenbush: Juanell Fairly Treating  Madine Sarr/Extender: Geralyn Corwin Weeks in Treatment: 0 Wound Status Wound Number: 4 Primary Etiology: Atypical Wound Location: Left, Circumferential Lower Leg Wound Status: Open Wounding Event: Gradually Appeared Comorbid History: Quadriplegia Date Acquired: 08/05/2021 Weeks Of Treatment: 0 Clustered Wound: No Photos Wound Measurements Length: (cm) 27 Width: (cm) 24 Depth: (cm) 0.1 Area: (cm) 508.938 Volume: (cm) 50.894 % Reduction in Area: % Reduction in Volume: Epithelialization: None Tunneling: No Undermining: No Wound Description Classification: Full Thickness Without Exposed Support Structures Exudate Amount: Medium Exudate Type: Sanguinous Exudate Color: red Foul Odor After Cleansing: No Slough/Fibrino No Wound Bed Granulation Amount: Large (67-100%) Exposed Structure Granulation Quality: Red, Hyper-granulation, Friable Fascia Exposed: No Fat Layer (Subcutaneous Tissue) Exposed: Yes Tendon Exposed: No Muscle Exposed: No Joint Exposed: No Bone Exposed: No Treatment Notes Wound #4 (Lower Leg) Wound Laterality: Left, Circumferential Cleanser Wound Cleanser Discharge Instruction: Wash your hands with soap and water. Remove old dressing, discard into plastic bag and place into trash. Cleanse the wound with Wound Cleanser prior to applying a clean dressing using gauze sponges, not tissues or cotton balls. Do not scrub or use excessive force. Pat dry using gauze sponges, not tissue or cotton balls. AUDRICK, LAMOUREAUX ([id number]) Peri-Wound Care Topical Triamcinolone Acetonide Cream, 0.1%, 15 (g) tube Discharge Instruction: Apply as directed by  Odel Schmid. Nystatin Cream, 30 (g) tube Discharge Instruction: in office spray at home Primary Dressing Secondary Dressing Kerlix 4.5 x 4.1 (in/yd) Discharge Instruction: Apply Kerlix 4.5 x 4.1 (in/yd) as instructed Secured With Medipore Tape - 37M Medipore H Soft Cloth Surgical Tape, 2x2 (in/yd) Compression Wrap Compression Stockings Add-Ons Electronic Signature(s) Signed: 10/30/2021 1:07:04 PM By: Yevonne Pax RN Entered By: Yevonne Pax on 10/30/2021 13:07:04 [name] ([id number]) -------------------------------------------------------------------------------- Wound Assessment Details Patient Name: [name] Date of Service: 10/30/2021 12:45 PM Medical Record Number: [id number] Patient Account Number: [id number] Date of Birth/Sex: 05-10-1985 (36 y.o. Male) Treating RN: Yevonne Pax Primary Care Heidie Krall: Unknown Foley Other Clinician: Referring Griffin Gerrard: Juanell Fairly Treating Makhayla Mcmurry/Extender: Tilda Franco in Treatment: 0 Wound Status Wound Number: 5 Primary Etiology: Atypical Wound Location: Anterior Foot Wound Status: Open Wounding Event: Gradually Appeared Comorbid History: Quadriplegia Date Acquired: 07/06/2021 Weeks Of Treatment: 0 Clustered Wound: No Photos Wound Measurements Length: (cm) 5 Width: (cm) 6 Depth: (cm) 0.1 Area: (cm) 23.562 Volume: (cm) 2.356 % Reduction in Area: % Reduction in Volume: Epithelialization: None Tunneling: No Undermining: No Wound Description Classification: Full Thickness Without Exposed Support Structures Exudate Amount: Medium Exudate Type: Sanguinous Exudate Color: red Foul Odor After Cleansing: No Slough/Fibrino Yes Wound Bed Granulation Amount: Small (1-33%) Exposed Structure Granulation Quality: Red, Hyper-granulation, Friable Fascia Exposed: No Necrotic Amount: Large (67-100%) Fat Layer (Subcutaneous Tissue) Exposed: Yes Necrotic Quality: Adherent Slough Tendon Exposed: No Muscle Exposed:  No Joint Exposed: No Bone Exposed: No Treatment Notes Wound #5 (Foot) Wound Laterality: Anterior Cleanser Wound Cleanser Discharge Instruction: Wash your hands with soap and water. Remove old dressing, discard into plastic bag and place into trash. Cleanse the wound with Wound Cleanser prior to applying a clean dressing using gauze sponges, not tissues or cotton balls. Do not scrub or use excessive force. Pat dry using gauze sponges, not tissue or cotton balls. ANTONIE, BORJON ([id number]) Peri-Wound Care Topical Triamcinolone Acetonide Cream, 0.1%, 15 (g) tube Discharge Instruction: Apply as directed by Lerone Onder. Nystatin Cream, 30 (g) tube Discharge Instruction: in office spray at home Primary Dressing Secondary Dressing Kerlix 4.5 x 4.1 (in/yd) Discharge Instruction: Apply Kerlix 4.5 x 4.1 (in/yd) as instructed Secured With Medipore Tape -  58M Medipore H Soft Cloth Surgical Tape, 2x2 (in/yd) Compression Wrap Compression Stockings Add-Ons Electronic Signature(s) Signed: 10/30/2021 1:08:13 PM By: Yevonne Pax RN Entered By: Yevonne Pax on 10/30/2021 13:08:13 [name] ([id number]) -------------------------------------------------------------------------------- Wound Assessment Details Patient Name: [name] Date of Service: 10/30/2021 12:45 PM Medical Record Number: [id number] Patient Account Number: [id number] Date of Birth/Sex: Jun 24, 1985 (36 y.o. Male) Treating RN: Yevonne Pax Primary Care Charnita Trudel: Unknown Foley Other Clinician: Referring Celese Banner: Juanell Fairly Treating Jamita Mckelvin/Extender: Tilda Franco in Treatment: 0 Wound Status Wound Number: 6 Primary Etiology: Atypical Wound Location: Back Wound Status: Open Wounding Event: Gradually Appeared Comorbid History: Quadriplegia Date Acquired: 07/06/2021 Weeks Of Treatment: 0 Clustered Wound: No Photos Wound Measurements Length: (cm) 33 Width: (cm) 30 Depth: (cm) 0.1 Area: (cm)  777.544 Volume: (cm) 77.754 % Reduction in Area: % Reduction in Volume: Epithelialization: None Tunneling: No Undermining: No Wound Description Classification: Full Thickness Without Exposed Support Structures Exudate Amount: Medium Exudate Type: Sanguinous Exudate Color: red Foul Odor After Cleansing: No Slough/Fibrino No Wound Bed Granulation Amount: Large (67-100%) Exposed Structure Granulation Quality: Red, Hyper-granulation, Friable Fascia Exposed: No Necrotic Amount: None Present (0%) Fat Layer (Subcutaneous Tissue) Exposed: Yes Tendon Exposed: No Muscle Exposed: No Joint Exposed: No Bone Exposed: No Treatment Notes Wound #6 (Back) Cleanser Wound Cleanser Discharge Instruction: Wash your hands with soap and water. Remove old dressing, discard into plastic bag and place into trash. Cleanse the wound with Wound Cleanser prior to applying a clean dressing using gauze sponges, not tissues or cotton balls. Do not scrub or use excessive force. Pat dry using gauze sponges, not tissue or cotton balls. TRASEAN, DELIMA ([id number]) Peri-Wound Care Topical Triamcinolone Acetonide Cream, 0.1%, 15 (g) tube Discharge Instruction: Apply as directed by Adonys Wildes. Nystatin Cream, 30 (g) tube Discharge Instruction: in office spray at home Primary Dressing Secondary Dressing Gauze Discharge Instruction: As directed: dry, moistened with saline or moistened with Dakins Solution Secured With Medipore Tape - 58M Medipore H Soft Cloth Surgical Tape, 2x2 (in/yd) Compression Wrap Compression Stockings Add-Ons Electronic Signature(s) Signed: 10/30/2021 1:09:13 PM By: Yevonne Pax RN Entered By: Yevonne Pax on 10/30/2021 13:09:13 [name] ([id number]) -------------------------------------------------------------------------------- Vitals Details Patient Name: [name] Date of Service: 10/30/2021 12:45 PM Medical Record Number: [id number] Patient Account Number: [id number] Date of  Birth/Sex: 02-23-1986 (36 y.o. Male) Treating RN: Yevonne Pax Primary Care Abbygale Lapid: Unknown Foley Other Clinician: Referring Shreshta Medley: Juanell Fairly Treating Breindy Meadow/Extender: Tilda Franco in Treatment: 0 Vital Signs Time Taken: 12:42 Temperature (F): 98 Height (in): 73 Pulse (bpm): 111 Source: Stated Respiratory Rate (breaths/min): 18 Weight (lbs): 185 Blood Pressure (mmHg): 110/77 Source: Stated Reference Range: 80 - 120 mg / dl Body Mass Index (BMI): 24.4 Electronic Signature(s) Signed: 10/31/2021 4:52:51 PM By: Yevonne Pax RN Entered By: Yevonne Pax on 10/30/2021 12:43:18

## 2021-10-31 NOTE — Progress Notes (Signed)
TYCEN, DOCKTER ([id number]) Visit Report for 10/30/2021 Abuse Risk Screen Details Patient Name: [name], [name] Date of Service: 10/30/2021 12:45 PM Medical Record Number: [id number] Patient Account Number: [id number] Date of Birth/Sex: 02/12/1986 (36 y.o. Male) Treating RN: Yevonne Pax Primary Care Drea Jurewicz: Unknown Foley Other Clinician: Referring Linsey Arteaga: Juanell Fairly Treating Oather Muilenburg/Extender: Tilda Franco in Treatment: 0 Abuse Risk Screen Items Answer ABUSE RISK SCREEN: Has anyone close to you tried to hurt or harm you recentlyo No Do you feel uncomfortable with anyone in your familyo No Has anyone forced you do things that you didnot want to doo No Electronic Signature(s) Signed: 10/31/2021 4:52:51 PM By: Yevonne Pax RN Entered By: Yevonne Pax on 10/30/2021 12:43:59 [name] ([id number]) -------------------------------------------------------------------------------- Activities of Daily Living Details Patient Name: [name] Date of Service: 10/30/2021 12:45 PM Medical Record Number: [id number] Patient Account Number: [id number] Date of Birth/Sex: [birth year] (36 y.o. Male) Treating RN: Yevonne Pax Primary Care Borna Wessinger: Unknown Foley Other Clinician: Referring Oneal Biglow: Juanell Fairly Treating Balraj Brayfield/Extender: Tilda Franco in Treatment: 0 Activities of Daily Living Items Answer Activities of Daily Living (Please select one for each item) Drive Automobile Not Able Take Medications Need Assistance Use Telephone Completely Able Care for Appearance Not Able Use Toilet Not Able Mady Haagensen / Shower Not Able Dress Self Not Able Feed Self Need Assistance Walk Not Able Get In / Out Bed Not Able Housework Not Able Prepare Meals Not Able Handle Money Need Assistance Shop for Self Need Assistance Electronic Signature(s) Signed: 10/31/2021 4:52:51 PM By: Yevonne Pax RN Entered By: Yevonne Pax on 10/30/2021 12:44:48 [name]  ([id number]) -------------------------------------------------------------------------------- Education Screening Details Patient Name: [name] Date of Service: 10/30/2021 12:45 PM Medical Record Number: [id number] Patient Account Number: [id number] Date of Birth/Sex: 03/17/1986 (36 y.o. Male) Treating RN: Yevonne Pax Primary Care Lariyah Shetterly: Unknown Foley Other Clinician: Referring Mike Hamre: Juanell Fairly Treating Waunita Sandstrom/Extender: Tilda Franco in Treatment: 0 Primary Learner Assessed: Patient Learning Preferences/Education Level/Primary Language Learning Preference: Explanation Highest Education Level: High School Preferred Language: English Cognitive Barrier Language Barrier: No Translator Needed: No Memory Deficit: No Emotional Barrier: No Cultural/Religious Beliefs Affecting Medical Care: No Physical Barrier Impaired Vision: Yes Glasses Impaired Hearing: No Decreased Hand dexterity: No Knowledge/Comprehension Knowledge Level: Medium Comprehension Level: Medium Ability to understand written instructions: Medium Ability to understand verbal instructions: Medium Motivation Anxiety Level: Anxious Cooperation: Cooperative Education Importance: Acknowledges Need Interest in Health Problems: Asks Questions Perception: Coherent Willingness to Engage in Self-Management High Activities: Readiness to Engage in Self-Management High Activities: Electronic Signature(s) Signed: 10/31/2021 4:52:51 PM By: Yevonne Pax RN Entered By: Yevonne Pax on 10/30/2021 12:45:19 [name] ([id number]) -------------------------------------------------------------------------------- Fall Risk Assessment Details Patient Name: [name] Date of Service: 10/30/2021 12:45 PM Medical Record Number: [id number] Patient Account Number: [id number] Date of Birth/Sex: [birth year] (36 y.o. Male) Treating RN: Yevonne Pax Primary Care Neidy Guerrieri: Unknown Foley Other  Clinician: Referring Saavi Mceachron: Juanell Fairly Treating Finas Delone/Extender: Tilda Franco in Treatment: 0 Fall Risk Assessment Items Have you had 2 or more falls in the last 12 monthso 0 No Have you had any fall that resulted in injury in the last 12 monthso 0 No FALLS RISK SCREEN History of falling - immediate or within 3 months 0 No Secondary diagnosis (Do you have 2 or more medical diagnoseso) 0 No Ambulatory aid None/bed rest/wheelchair/nurse 0 No Crutches/cane/walker 0 No Furniture 0 No Intravenous therapy Access/Saline/Heparin Lock 0 No Gait/Transferring Normal/ bed rest/ wheelchair 0 No Weak (short steps with or without shuffle, stooped but able to lift  head while walking, may 0 No seek support from furniture) Impaired (short steps with shuffle, may have difficulty arising from chair, head down, impaired 0 No balance) Mental Status Oriented to own ability 0 No Electronic Signature(s) Signed: 10/31/2021 4:52:51 PM By: Yevonne Pax RN Entered By: Yevonne Pax on 10/30/2021 12:45:26 [name] ([id number]) -------------------------------------------------------------------------------- Foot Assessment Details Patient Name: [name] Date of Service: 10/30/2021 12:45 PM Medical Record Number: [id number] Patient Account Number: [id number] Date of Birth/Sex: 1985-11-06 (36 y.o. Male) Treating RN: Yevonne Pax Primary Care Dempsey Knotek: Unknown Foley Other Clinician: Referring Lamonte Hartt: Juanell Fairly Treating Francelia Mclaren/Extender: Tilda Franco in Treatment: 0 Foot Assessment Items Site Locations + = Sensation present, - = Sensation absent, C = Callus, U = Ulcer R = Redness, W = Warmth, M = Maceration, PU = Pre-ulcerative lesion F = Fissure, S = Swelling, D = Dryness Assessment Right: Left: Other Deformity: No No Prior Foot Ulcer: No No Prior Amputation: No No Charcot Joint: No No Ambulatory Status: Non-ambulatory Assistance Device:  Wheelchair Gait: Surveyor, mining) Signed: 10/31/2021 4:52:51 PM By: Yevonne Pax RN Entered By: Yevonne Pax on 10/30/2021 12:46:57 [name] ([id number]) -------------------------------------------------------------------------------- Nutrition Risk Screening Details Patient Name: [name] Date of Service: 10/30/2021 12:45 PM Medical Record Number: [id number] Patient Account Number: [id number] Date of Birth/Sex: 03-30-1986 (36 y.o. Male) Treating RN: Yevonne Pax Primary Care Haevyn Ury: Unknown Foley Other Clinician: Referring Abdurrahman Petersheim: Juanell Fairly Treating Chaselynn Kepple/Extender: Tilda Franco in Treatment: 0 Height (in): 73 Weight (lbs): 185 Body Mass Index (BMI): 24.4 Nutrition Risk Screening Items Score Screening NUTRITION RISK SCREEN: I have an illness or condition that made me change the kind and/or amount of food I eat 0 No I eat fewer than two meals per day 0 No I eat few fruits and vegetables, or milk products 0 No I have three or more drinks of beer, liquor or wine almost every day 0 No I have tooth or mouth problems that make it hard for me to eat 0 No I don't always have enough money to buy the food I need 0 No I eat alone most of the time 0 No I take three or more different prescribed or over-the-counter drugs a day 1 Yes Without wanting to, I have lost or gained 10 pounds in the last six months 0 No I am not always physically able to shop, cook and/or feed myself 0 No Nutrition Protocols Good Risk Protocol 0 No interventions needed Moderate Risk Protocol High Risk Proctocol Risk Level: Good Risk Score: 1 Electronic Signature(s) Signed: 10/31/2021 4:52:51 PM By: Yevonne Pax RN Entered By: Yevonne Pax on 10/30/2021 12:46:09

## 2021-11-06 ENCOUNTER — Encounter: Payer: Medicare Other | Attending: Internal Medicine | Admitting: Internal Medicine

## 2021-11-06 DIAGNOSIS — L988 Other specified disorders of the skin and subcutaneous tissue: Secondary | ICD-10-CM | POA: Diagnosis not present

## 2021-11-06 DIAGNOSIS — B354 Tinea corporis: Secondary | ICD-10-CM | POA: Diagnosis not present

## 2021-11-06 DIAGNOSIS — G8222 Paraplegia, incomplete: Secondary | ICD-10-CM | POA: Insufficient documentation

## 2021-11-06 DIAGNOSIS — L97829 Non-pressure chronic ulcer of other part of left lower leg with unspecified severity: Secondary | ICD-10-CM | POA: Insufficient documentation

## 2021-11-06 DIAGNOSIS — L98422 Non-pressure chronic ulcer of back with fat layer exposed: Secondary | ICD-10-CM | POA: Insufficient documentation

## 2021-11-11 NOTE — Progress Notes (Signed)
RIGBY, LAQUE ([id number]) Visit Report for 11/06/2021 Chief Complaint Document Details Patient Name: [name] Date of Service: 11/06/2021 12:30 PM Medical Record Number: [id number] Patient Account Number: [id number] Date of Birth/Sex: 1986-03-10 (36 y.o. M) Treating RN: Carlene Coria Primary Care Provider: August Luz Other Clinician: Massie Kluver Referring Provider: August Luz Treating Provider/Extender: Yaakov Guthrie in Treatment: 1 Information Obtained from: Patient Chief Complaint 10/30/2021; left lower extremity wounds, back wounds Electronic Signature(s) Signed: 11/06/2021 1:35:59 PM By: Kalman Shan DO Entered By: Kalman Shan on 11/06/2021 13:29:18 [name] ([id number]) -------------------------------------------------------------------------------- HPI Details Patient Name: [name] Date of Service: 11/06/2021 12:30 PM Medical Record Number: [id number] Patient Account Number: [id number] Date of Birth/Sex: 10-12-1985 (35 y.o. M) Treating RN: Carlene Coria Primary Care Provider: August Luz Other Clinician: Massie Kluver Referring Provider: August Luz Treating Provider/Extender: Yaakov Guthrie in Treatment: 1 History of Present Illness HPI Description: 03/18/2021 upon evaluation today patient presents for initial inspection here in the clinic concerning issues that he has been having with his back. Fortunately this seems to be related to multiple potential issues here which involve moisture, pressure, and I believe a fungal infection as well. In the past he seems to have responded to steroids topically. With that being said I am thinking that might still be something that could be helpful for him. I am going to see about setting this and to the pharmacy to get things started. I also think we may want to do a wound culture in order to evaluate for any possibilities there. 04/19/2021 upon evaluation today patient actually appears to  be doing excellent in regard to his back. He is actually making wonderful progress as far as this is concerned. I do not see any signs of infection at this time which is great news and overall I think that he is doing quite well. Admission 10/30/2021 Mr. [name] is a 36 year old male with a past medical history of paraplegia that presents with extensive wounds to his back and left leg. He was seen in our clinic earlier in the year for a similar presentation but limited to the back area. This was treated effectively with antifungal spray, triamcinolone cream and oral antibiotics. He again presents today with similar wounds to his back. Unfortunately he has also developed wounds to his left lower extremity. He states that the wounds started after he was casted for a broken leg. He has also developed wounds to the left upper thigh. He states he was sunburned to this area and subsequently developed wounds. He has currently been keeping the wounds covered. He denies systemic signs of infection. 8/2; patient presents for follow-up. Has been using triamcinolone cream with antifungal spray on the wound beds. He has been taking Augmentin without issues. He reports improvement in wound healing. Electronic Signature(s) Signed: 11/06/2021 1:35:59 PM By: Kalman Shan DO Entered By: Kalman Shan on 11/06/2021 13:29:45 [name] ([id number]) -------------------------------------------------------------------------------- Physical Exam Details Patient Name: [name] Date of Service: 11/06/2021 12:30 PM Medical Record Number: [id number] Patient Account Number: [id number] Date of Birth/Sex: [birth year] (35 y.o. M) Treating RN: Carlene Coria Primary Care Provider: August Luz Other Clinician: Massie Kluver Referring Provider: August Luz Treating Provider/Extender: Yaakov Guthrie in Treatment: 1 Constitutional . Cardiovascular . Psychiatric . Notes Back: Multiple extensive  open wounds with granulated tissue throughout. Left lower extremity: Extensive wounds throughout the leg with granulation tissue, hyper granulated areas. No surrounding soft tissue infection. Overall wounds do not appear healthy. Electronic Signature(s) Signed: 11/06/2021 1:35:59 PM By:  Geralyn Corwin DO Entered By: Geralyn Corwin on 11/06/2021 13:30:24 [name] ([id number]) -------------------------------------------------------------------------------- Physician Orders Details Patient Name: [name] Date of Service: 11/06/2021 12:30 PM Medical Record Number: [id number] Patient Account Number: [id number] Date of Birth/Sex: 12/10/1985 (35 y.o. M) Treating RN: Yevonne Pax Primary Care Provider: Unknown Foley Other Clinician: Betha Loa Referring Provider: Unknown Foley Treating Provider/Extender: Tilda Franco in Treatment: 1 Verbal / Phone Orders: No Diagnosis Coding Follow-up Appointments o Return Appointment in 1 week. Bathing/ Shower/ Hygiene o May shower; gently cleanse wound with antibacterial soap, rinse and pat dry prior to dressing wounds Wound Treatment Wound #3 - Upper Leg Wound Laterality: Left Cleanser: Wound Cleanser 1 x Per Day/30 Days Discharge Instructions: Wash your hands with soap and water. Remove old dressing, discard into plastic bag and place into trash. Cleanse the wound with Wound Cleanser prior to applying a clean dressing using gauze sponges, not tissues or cotton balls. Do not scrub or use excessive force. Pat dry using gauze sponges, not tissue or cotton balls. Topical: Triamcinolone Acetonide Cream, 0.1%, 15 (g) tube 1 x Per Day/30 Days Discharge Instructions: Apply as directed by provider. Topical: Nystatin Cream, 30 (g) tube 1 x Per Day/30 Days Discharge Instructions: in office spray at home Secondary Dressing: ABD Pad 5x9 (in/in) 1 x Per Day/30 Days Discharge Instructions: Cover with ABD pad Secured With: Medipore Tape -  58M Medipore H Soft Cloth Surgical Tape, 2x2 (in/yd) 1 x Per Day/30 Days Wound #4 - Lower Leg Wound Laterality: Left, Circumferential Cleanser: Wound Cleanser 1 x Per Day/30 Days Discharge Instructions: Wash your hands with soap and water. Remove old dressing, discard into plastic bag and place into trash. Cleanse the wound with Wound Cleanser prior to applying a clean dressing using gauze sponges, not tissues or cotton balls. Do not scrub or use excessive force. Pat dry using gauze sponges, not tissue or cotton balls. Topical: Triamcinolone Acetonide Cream, 0.1%, 15 (g) tube 1 x Per Day/30 Days Discharge Instructions: Apply as directed by provider. Topical: Nystatin Cream, 30 (g) tube 1 x Per Day/30 Days Discharge Instructions: in office spray at home Secondary Dressing: Kerlix 4.5 x 4.1 (in/yd) 1 x Per Day/30 Days Discharge Instructions: Apply Kerlix 4.5 x 4.1 (in/yd) as instructed Secured With: Medipore Tape - 58M Medipore H Soft Cloth Surgical Tape, 2x2 (in/yd) 1 x Per Day/30 Days Wound #5 - Foot Wound Laterality: Anterior Cleanser: Wound Cleanser 1 x Per Day/30 Days Discharge Instructions: Wash your hands with soap and water. Remove old dressing, discard into plastic bag and place into trash. Cleanse the wound with Wound Cleanser prior to applying a clean dressing using gauze sponges, not tissues or cotton balls. Do not scrub or use excessive force. Pat dry using gauze sponges, not tissue or cotton balls. Topical: Triamcinolone Acetonide Cream, 0.1%, 15 (g) tube 1 x Per Day/30 Days Discharge Instructions: Apply as directed by provider. Topical: Nystatin Cream, 30 (g) tube 1 x Per Day/30 Days Discharge Instructions: in office spray at home Secondary Dressing: Kerlix 4.5 x 4.1 (in/yd) 1 x Per Day/30 Days CALOGERO, GEISEN ([id number]) Discharge Instructions: Apply Kerlix 4.5 x 4.1 (in/yd) as instructed Secured With: Medipore Tape - 58M Medipore H Soft Cloth Surgical Tape, 2x2 (in/yd) 1 x Per  Day/30 Days Wound #6 - Back Cleanser: Wound Cleanser 1 x Per Day/30 Days Discharge Instructions: Wash your hands with soap and water. Remove old dressing, discard into plastic bag and place into trash. Cleanse the wound with Wound Cleanser prior to  applying a clean dressing using gauze sponges, not tissues or cotton balls. Do not scrub or use excessive force. Pat dry using gauze sponges, not tissue or cotton balls. Topical: Triamcinolone Acetonide Cream, 0.1%, 15 (g) tube 1 x Per Day/30 Days Discharge Instructions: Apply as directed by provider. Topical: Nystatin Cream, 30 (g) tube 1 x Per Day/30 Days Discharge Instructions: in office spray at home Secondary Dressing: Gauze 1 x Per Day/30 Days Discharge Instructions: As directed: dry, moistened with saline or moistened with Dakins Solution Secured With: [place] H Soft Cloth Surgical Tape, 2x2 (in/yd) 1 x Per Day/30 Days Electronic Signature(s) Signed: 11/06/2021 1:45:56 PM By: Kalman Shan DO Signed: 11/06/2021 4:00:18 PM By: Massie Kluver Previous Signature: 11/06/2021 1:35:59 PM Version By: Kalman Shan DO Entered By: Massie Kluver on 11/06/2021 13:37:23 [name] ([id number]) -------------------------------------------------------------------------------- Problem List Details Patient Name: [name] Date of Service: 11/06/2021 12:30 PM Medical Record Number: [id number] Patient Account Number: [id number] Date of Birth/Sex: 1985/08/29 (35 y.o. 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 10/30/2021 No Yes L97.829 Non-pressure chronic ulcer of other part of left lower leg with 10/30/2021 No Yes unspecified  severity B35.4 Tinea corporis 10/30/2021 No Yes L98.8 Other specified disorders of the skin and subcutaneous tissue 10/30/2021 No Yes G82.22 Paraplegia, incomplete 10/30/2021 No Yes Inactive Problems Resolved Problems Electronic Signature(s) Signed: 11/06/2021 1:35:59 PM By: Kalman Shan DO Entered By: Kalman Shan on 11/06/2021 13:29:15 [name] ([id number]) -------------------------------------------------------------------------------- Progress Note Details Patient Name: [name] Date of Service: 11/06/2021 12:30 PM Medical Record Number: [id number] Patient Account Number: [id number] Date of Birth/Sex: 08-23-1985 (35 y.o. M) Treating RN: Carlene Coria Primary Care Provider: August Luz Other Clinician: Massie Kluver Referring Provider: August Luz Treating Provider/Extender: Yaakov Guthrie in Treatment: 1 Subjective Chief Complaint Information obtained from Patient 10/30/2021; left lower extremity wounds, back wounds History of Present Illness (HPI) 03/18/2021 upon evaluation today patient presents for initial inspection here in the clinic concerning issues that he has been having with his back. Fortunately this seems to be related to multiple potential issues here which involve moisture, pressure, and I believe a fungal infection as well. In the past he seems to have responded to steroids topically. With that being said I am thinking that might still be something that could be helpful for him. I am going to see about setting this and to the pharmacy to get things started. I also think we may want to do a wound culture in order to evaluate for any possibilities there. 04/19/2021 upon evaluation today patient actually appears to be doing excellent in regard to his back. He is actually making wonderful progress as far as this is concerned. I do not see any signs of infection at this time which is great news and overall I think that he is doing quite  well. Admission 10/30/2021 Mr. Junichi Boas is a 36 year old male with a past medical history of paraplegia that presents with extensive wounds to his back and left leg. He was seen in our clinic earlier in the year for a similar presentation but limited to the back area. This was treated effectively with antifungal spray, triamcinolone cream and oral antibiotics. He again presents today with similar wounds to his back. Unfortunately he has also developed wounds  to his left lower extremity. He states that the wounds started after he was casted for a broken leg. He has also developed wounds to the left upper thigh. He states he was sunburned to this area and subsequently developed wounds. He has currently been keeping the wounds covered. He denies systemic signs of infection. 8/2; patient presents for follow-up. Has been using triamcinolone cream with antifungal spray on the wound beds. He has been taking Augmentin without issues. He reports improvement in wound healing. Objective Constitutional Vitals Time Taken: 12:48 PM, Height: 73 in, Weight: 185 lbs, BMI: 24.4, Temperature: 98.0 F, Pulse: 78 bpm, Respiratory Rate: 16 breaths/min, Blood Pressure: 101/69 mmHg. General Notes: Back: Multiple extensive open wounds with granulated tissue throughout. Left lower extremity: Extensive wounds throughout the leg with granulation tissue, hyper granulated areas. No surrounding soft tissue infection. Overall wounds do not appear healthy. Integumentary (Hair, Skin) Wound #3 status is Open. Original cause of wound was Other Lesion. The date acquired was: 08/05/2021. The wound has been in treatment 1 weeks. The wound is located on the Left Upper Leg. The wound measures 13.2cm length x 8cm width x 0.1cm depth; 82.938cm^2 area and 8.294cm^3 volume. There is Fat Layer (Subcutaneous Tissue) exposed. There is a medium amount of sanguinous drainage noted. There is large (67-100%) friable, hyper - granulation within  the wound bed. There is no necrotic tissue within the wound bed. Wound #4 status is Open. Original cause of wound was Gradually Appeared. The date acquired was: 08/05/2021. The wound has been in treatment 1 weeks. The wound is located on the Left,Circumferential Lower Leg. The wound measures 18.5cm length x 16.5cm width x 0.1cm depth; 239.743cm^2 area and 23.974cm^3 volume. There is Fat Layer (Subcutaneous Tissue) exposed. There is a medium amount of sanguinous drainage noted. There is large (67-100%) red, friable, hyper - granulation within the wound bed. Wound #5 status is Open. Original cause of wound was Gradually Appeared. The date acquired was: 07/06/2021. The wound has been in treatment 1 weeks. The wound is located on the Anterior Foot. The wound measures 0.6cm length x 1.4cm width x 0.1cm depth; 0.66cm^2 area and 0.066cm^3 volume. There is Fat Layer (Subcutaneous Tissue) exposed. There is a medium amount of sanguinous drainage noted. There is small (1-33%) red, friable, hyper - granulation within the wound bed. There is a large (67-100%) amount of necrotic tissue within the wound bed including Adherent Slough. Wound #6 status is Open. Original cause of wound was Gradually Appeared. The date acquired was: 07/06/2021. The wound has been in treatment 1 weeks. The wound is located on the Back. The wound measures 24.1cm length x 31cm width x 0.1cm depth; 586.771cm^2 area and [place], [place] ([id number]) 58.677cm^3 volume. There is Fat Layer (Subcutaneous Tissue) exposed. There is a medium amount of sanguinous drainage noted. There is large (67-100%) red, friable, hyper - granulation within the wound bed. There is no necrotic tissue within the wound bed. 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 I recommended continuing with triamcinolone cream and antifungal spray to the wound beds. Complete oral antibiotic course. Follow-up in 2 weeks. Plan Follow-up Appointments: Return Appointment in 1 week. Bathing/ Shower/ Hygiene: May shower; gently cleanse wound with antibacterial soap, rinse and pat dry  prior to dressing wounds WOUND #3: - Upper Leg Wound Laterality: Left Cleanser: Wound Cleanser 1 x Per Day/30 Days Discharge Instructions: Wash your hands with soap and water. Remove old dressing, discard into plastic bag and place into trash. Cleanse the wound with Wound Cleanser prior to applying a clean dressing using gauze sponges, not tissues or cotton balls. Do not scrub or use excessive force. Pat dry using gauze sponges, not tissue or cotton balls. Topical: Triamcinolone Acetonide Cream, 0.1%, 15 (g) tube 1 x Per Day/30 Days Discharge Instructions: Apply as directed by provider. Topical: Nystatin Cream, 30 (g) tube 1 x Per Day/30 Days Discharge Instructions: in office spray at home Secondary Dressing: ABD Pad 5x9 (in/in) 1 x Per Day/30 Days Discharge Instructions: Cover with ABD pad Secured With: Medipore Tape - 90M Medipore H Soft Cloth Surgical Tape, 2x2 (in/yd) 1 x Per Day/30 Days WOUND #4: - Lower Leg Wound Laterality: Left, Circumferential Cleanser: Wound Cleanser 1 x Per Day/30 Days Discharge Instructions: Wash your hands with soap and water. Remove old dressing, discard into plastic bag and place into trash. Cleanse the wound with Wound Cleanser prior to applying a clean dressing using gauze sponges, not tissues or cotton balls. Do not scrub or use excessive force. Pat dry using gauze sponges, not tissue or cotton balls. Topical: Triamcinolone Acetonide Cream, 0.1%, 15 (g) tube 1 x Per Day/30 Days Discharge Instructions: Apply as directed by  provider. Topical: Nystatin Cream, 30 (g) tube 1 x Per Day/30 Days Discharge Instructions: in office spray at home Secondary Dressing: Kerlix 4.5 x 4.1 (in/yd) 1 x Per Day/30 Days Discharge Instructions: Apply Kerlix 4.5 x 4.1 (in/yd) as instructed Secured With: Medipore Tape - 90M Medipore H Soft Cloth Surgical Tape, 2x2 (in/yd) 1 x Per Day/30 Days WOUND #5: - Foot Wound Laterality: Anterior Cleanser: Wound Cleanser 1 x Per Day/30 Days Discharge Instructions: Wash your hands with soap and water. Remove old dressing, discard into plastic bag and place into trash. Cleanse the wound with Wound Cleanser prior to applying a clean dressing using gauze sponges, not tissues or cotton balls. Do not scrub or use excessive force. Pat dry using gauze sponges, not tissue or cotton balls. Topical: Triamcinolone Acetonide Cream, 0.1%, 15 (g) tube 1 x Per Day/30 Days Discharge Instructions: Apply as directed by provider. Topical: Nystatin Cream, 30 (g) tube 1 x Per Day/30 Days Discharge Instructions: in office spray at home Secondary Dressing: Kerlix 4.5 x 4.1 (in/yd) 1 x Per Day/30 Days Discharge Instructions: Apply Kerlix 4.5 x 4.1 (in/yd) as instructed Secured With: Medipore Tape - 90M Medipore H Soft Cloth Surgical Tape, 2x2 (in/yd) 1 x Per Day/30 Days WOUND #6: - Back Wound Laterality: Cleanser: Wound Cleanser 1 x Per Day/30 Days Discharge Instructions: Wash your hands with soap and water. Remove old dressing, discard into plastic bag and place into trash. Cleanse the wound with Wound Cleanser prior to applying a clean dressing using gauze sponges, not tissues or cotton balls. Do not scrub or use excessive force. Pat dry using gauze sponges, not tissue or cotton balls. Topical: Triamcinolone Acetonide Cream, 0.1%, 15 (g) tube 1 x Per Day/30 Days Discharge Instructions: Apply as directed by provider. Topical: Nystatin Cream, 30 (g) tube 1 x Per Day/30 Days Discharge Instructions: in office spray at  home [place], [name] ([id number]) Secondary Dressing: Gauze 1 x Per Day/30 Days Discharge Instructions: As directed: dry, moistened with saline or moistened with Dakins Solution Secured With: Medipore Tape - 90M Medipore H Soft Cloth  Surgical Tape, 2x2 (in/yd) 1 x Per Day/30 Days 1. Triamcinolone cream with antifungal spray 2. Complete Augmentin 3. Follow-up in 2 weeks Electronic Signature(s) Signed: 11/06/2021 1:35:59 PM By: Kalman Shan DO Entered By: Kalman Shan on 11/06/2021 13:34:41 [name] ([id number]) -------------------------------------------------------------------------------- ROS/PFSH Details Patient Name: [name] Date of Service: 11/06/2021 12:30 PM Medical Record Number: [id number] Patient Account Number: [id number] Date of Birth/Sex: 1986/03/19 (35 y.o. M) Treating RN: Carlene Coria Primary Care Provider: August Luz Other Clinician: Massie Kluver Referring Provider: August Luz Treating Provider/Extender: Yaakov Guthrie in Treatment: 1 Information Obtained From Patient Neurologic Medical History: Positive for: Quadriplegia Immunizations Pneumococcal Vaccine: Received Pneumococcal Vaccination: No Implantable Devices None Family and Social History Former smoker; Marital Status - Divorced; Alcohol Use: Never; Drug Use: Current History - CBD; Caffeine Use: Daily; Financial Concerns: No; Food, Clothing or Shelter Needs: No; Support System Lacking: No; Transportation Concerns: No Electronic Signature(s) Signed: 11/06/2021 1:35:59 PM By: Kalman Shan DO Signed: 11/11/2021 8:34:39 AM By: Carlene Coria RN Entered By: Kalman Shan on 11/06/2021 13:35:31 [name] ([id number]) -------------------------------------------------------------------------------- SuperBill Details Patient Name: [name] Date of Service: 11/06/2021 Medical Record Number: [id number] Patient Account Number: [id number] Date of Birth/Sex: 29-Oct-1985 (35 y.o.  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 L97.829 Non-pressure chronic ulcer of other part of left lower leg with unspecified severity B35.4 Tinea corporis L98.8 Other specified disorders of the skin and subcutaneous tissue G82.22 Paraplegia, incomplete Facility Procedures CPT4 Code: [id number] Description: [phone] - WOUND CARE VISIT-LEV 5 EST PT Modifier: Quantity: 1 Physician Procedures CPT4 Code: [id number] Description: [id number] - WC PHYS LEVEL 3 - EST PT Modifier: Quantity: 1 CPT4 Code: Description: ICD-10 Diagnosis Description L98.422 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

## 2021-11-11 NOTE — Progress Notes (Signed)
BOSTON, COOKSON ([id number]) Visit Report for 11/06/2021 Arrival Information Details Patient Name: [name], [name] Date of Service: 11/06/2021 12:30 PM Medical Record Number: [id number] Patient Account Number: [id number] Date of Birth/Sex: Dec 13, 1985 (36 y.o. M) Treating RN: Yevonne Pax Primary Care Amonie Wisser: Unknown Foley Other Clinician: Betha Loa Referring Tarry Fountain: Unknown Foley Treating Frederika Hukill/Extender: Tilda Franco in Treatment: 1 Visit Information History Since Last Visit All ordered tests and consults were completed: No Patient Arrived: Wheel Chair Added or deleted any medications: No Arrival Time: 12:47 Any new allergies or adverse reactions: No Transfer Assistance: None Had a fall or experienced change in No Patient Requires Transmission-Based Precautions: No activities of daily living that may affect Patient Has Alerts: No risk of falls: Hospitalized since last visit: No Pain Present Now: No Electronic Signature(s) Signed: 11/06/2021 4:00:18 PM By: Betha Loa Entered By: Betha Loa on 11/06/2021 12:47:55 [name] ([id number]) -------------------------------------------------------------------------------- Clinic Level of Care Assessment Details Patient Name: [name] Date of Service: 11/06/2021 12:30 PM Medical Record Number: [id number] Patient Account Number: [id number] Date of Birth/Sex: 1985-12-23 (35 y.o. M) Treating RN: Yevonne Pax Primary Care Payge Eppes: Unknown Foley Other Clinician: Betha Loa Referring Linford Quintela: Unknown Foley Treating Chauncey Sciulli/Extender: Tilda Franco in Treatment: 1 Clinic Level of Care Assessment Items TOOL 4 Quantity Score []  - Use when only an EandM is performed on FOLLOW-UP visit 0 ASSESSMENTS - Nursing Assessment / Reassessment X - Reassessment of Co-morbidities (includes updates in patient status) 1 10 X- 1 5 Reassessment of Adherence to Treatment Plan ASSESSMENTS - Wound and Skin  Assessment / Reassessment []  - Simple Wound Assessment / Reassessment - one wound 0 X- 4 5 Complex Wound Assessment / Reassessment - multiple wounds []  - 0 Dermatologic / Skin Assessment (not related to wound area) ASSESSMENTS - Focused Assessment []  - Circumferential Edema Measurements - multi extremities 0 []  - 0 Nutritional Assessment / Counseling / Intervention []  - 0 Lower Extremity Assessment (monofilament, tuning fork, pulses) []  - 0 Peripheral Arterial Disease Assessment (using hand held doppler) ASSESSMENTS - Ostomy and/or Continence Assessment and Care []  - Incontinence Assessment and Management 0 []  - 0 Ostomy Care Assessment and Management (repouching, etc.) PROCESS - Coordination of Care X - Simple Patient / Family Education for ongoing care 1 15 []  - 0 Complex (extensive) Patient / Family Education for ongoing care []  - 0 Staff obtains , Records, Test Results / Process Orders []  - 0 Staff telephones HHA, Nursing Homes / Clarify orders / etc []  - 0 Routine Transfer to another Facility (non-emergent condition) []  - 0 Routine Hospital Admission (non-emergent condition) []  - 0 New Admissions / / Ordering NPWT, Apligraf, etc. []  - 0 Emergency Hospital Admission (emergent condition) X- 1 10 Simple Discharge Coordination []  - 0 Complex (extensive) Discharge Coordination PROCESS - Special Needs []  - Pediatric / Minor Patient Management 0 []  - 0 Isolation Patient Management []  - 0 Hearing / Language / Visual special needs []  - 0 Assessment of Community assistance (transportation, D/C planning, etc.) []  - 0 Additional assistance / Altered mentation []  - 0 Support Surface(s) Assessment (bed, cushion, seat, etc.) INTERVENTIONS - Wound Cleansing / Measurement Gaertner, Shulem ( ) []  - 0 Simple Wound Cleansing - one wound X- 4 5 Complex Wound Cleansing - multiple wounds X- 1 5 Wound Imaging (photographs - any number of  wounds) []  - 0 Wound Tracing (instead of photographs) []  - 0 Simple Wound Measurement - one wound X- 4 5 Complex Wound Measurement - multiple wounds INTERVENTIONS -  Wound Dressings []  - Small Wound Dressing one or multiple wounds 0 X- 4 15 Medium Wound Dressing one or multiple wounds []  - 0 Large Wound Dressing one or multiple wounds X- 1 5 Application of Medications - topical []  - 0 Application of Medications - injection INTERVENTIONS - Miscellaneous []  - External ear exam 0 []  - 0 Specimen Collection (cultures, biopsies, blood, body fluids, etc.) []  - 0 Specimen(s) / Culture(s) sent or taken to Lab for analysis []  - 0 Patient Transfer (multiple staff / Nurse, adultHoyer Lift / Similar devices) []  - 0 Simple Staple / Suture removal (25 or less) []  - 0 Complex Staple / Suture removal (26 or more) []  - 0 Hypo / Hyperglycemic Management (close monitor of Blood Glucose) []  - 0 Ankle / Brachial Index (ABI) - do not check if billed separately X- 1 5 Vital Signs Has the patient been seen at the hospital within the last three years: Yes Total Score: 175 Level Of Care: New/Established - Level 5 Electronic Signature(s) Signed: 11/06/2021 4:00:18 PM By: Betha LoaVenable, Angie Entered By: Betha LoaVenable, Angie on 11/06/2021 13:38:08 Robert SpringEESE, Sandor ([id number]) -------------------------------------------------------------------------------- Encounter Discharge Information Details Patient Name: Robert SpringEESE, Robert Date of Service: 11/06/2021 12:30 PM Medical Record Number: [id number] Patient Account Number: [id number] Date of Birth/Sex: [birth year] (35 y.o. M) Treating RN: Yevonne PaxEpps, Carrie Primary Care Emersyn Kotarski: Unknown FoleyWhitten, Robin Other Clinician: Betha LoaVenable, Angie Referring Bradon Fester: Unknown FoleyWhitten, Robin Treating Caldwell Kronenberger/Extender: Tilda FrancoHoffman, Jessica Weeks in Treatment: 1 Encounter Discharge Information Items Discharge Condition: Stable Ambulatory Status: Wheelchair Discharge Destination: Home Transportation: Private  Auto Accompanied By: girlfriend Schedule Follow-up Appointment: Yes Clinical Summary of Care: Electronic Signature(s) Signed: 11/06/2021 4:00:18 PM By: Betha LoaVenable, Angie Entered By: Betha LoaVenable, Angie on 11/06/2021 13:39:27 Robert SpringEESE, Odean ([id number]) -------------------------------------------------------------------------------- Lower Extremity Assessment Details Patient Name: Robert SpringEESE, Isaias Date of Service: 11/06/2021 12:30 PM Medical Record Number: [id number] Patient Account Number: [id number] Date of Birth/Sex: [birth year] (35 y.o. M) Treating RN: Yevonne PaxEpps, Carrie Primary Care Kasidee Voisin: Unknown FoleyWhitten, Robin Other Clinician: Betha LoaVenable, Angie Referring Ardel Jagger: Unknown FoleyWhitten, Robin Treating Brandell Maready/Extender: Tilda FrancoHoffman, Jessica Weeks in Treatment: 1 Electronic Signature(s) Signed: 11/06/2021 4:00:18 PM By: Betha LoaVenable, Angie Signed: 11/11/2021 8:34:39 AM By: Yevonne PaxEpps, Carrie RN Entered By: Betha LoaVenable, Angie on 11/06/2021 13:10:31 Robert SpringEESE, Hulet ([id number]) -------------------------------------------------------------------------------- Multi Wound Chart Details Patient Name: Robert SpringEESE, Masaichi Date of Service: 11/06/2021 12:30 PM Medical Record Number: [id number] Patient Account Number: [id number] Date of Birth/Sex: [birth year] (35 y.o. M) Treating RN: Yevonne PaxEpps, Carrie Primary Care Jacee Enerson: Unknown FoleyWhitten, Robin Other Clinician: Betha LoaVenable, Angie Referring Azam Gervasi: Unknown FoleyWhitten, Robin Treating Dawan Farney/Extender: Tilda FrancoHoffman, Jessica Weeks in Treatment: 1 Vital Signs Height(in): 73 Pulse(bpm): 78 Weight(lbs): 185 Blood Pressure(mmHg): 101/69 Body Mass Index(BMI): 24.4 Temperature(F): 98.0 Respiratory Rate(breaths/min): 16 Photos: Wound Location: Left Upper Leg Left, Circumferential Lower Leg Anterior Foot Wounding Event: Other Lesion Gradually Appeared Gradually Appeared Primary Etiology: Atypical Atypical Atypical Comorbid History: Quadriplegia Quadriplegia Quadriplegia Date Acquired: 08/05/2021 08/05/2021 07/06/2021 Weeks of Treatment: 1 1  1  Wound Status: Open Open Open Wound Recurrence: No No No Measurements L x W x D (cm) 13.2x8x0.1 18.5x16.5x0.1 0.6x1.4x0.1 Area (cm) : 82.938 239.743 0.66 Volume (cm) : 8.294 23.974 0.066 % Reduction in Area: 28.90% 52.90% 97.20% % Reduction in Volume: 28.90% 52.90% 97.20% Classification: Full Thickness Without Exposed Full Thickness Without Exposed Full Thickness Without Exposed Support Structures Support Structures Support Structures Exudate Amount: Medium Medium Medium Exudate Type: Sanguinous Sanguinous Sanguinous Exudate Color: red red red Granulation Amount: Large (67-100%) Large (67-100%) Small (1-33%) Granulation Quality: Hyper-granulation, Friable Red, Hyper-granulation, Friable Red, Hyper-granulation, Friable Necrotic Amount: None Present (0%) N/A Large (67-100%) Exposed Structures:  Fat Layer (Subcutaneous Tissue): Fat Layer (Subcutaneous Tissue): Fat Layer (Subcutaneous Tissue): Yes Yes Yes Fascia: No Fascia: No Fascia: No Tendon: No Tendon: No Tendon: No Muscle: No Muscle: No Muscle: No Joint: No Joint: No Joint: No Bone: No Bone: No Bone: No Epithelialization: None None None Wound Number: 6 N/A N/A Photos: N/A N/A Wound Location: Back N/A N/A JAKEOB, TULLIS ([id number]) Wounding Event: Gradually Appeared N/A N/A Primary Etiology: Atypical N/A N/A Comorbid History: Quadriplegia N/A N/A Date Acquired: 07/06/2021 N/A N/A Weeks of Treatment: 1 N/A N/A Wound Status: Open N/A N/A Wound Recurrence: No N/A N/A Measurements L x W x D (cm) 24.1x31x0.1 N/A N/A Area (cm) : 586.771 N/A N/A Volume (cm) : 58.677 N/A N/A % Reduction in Area: 24.50% N/A N/A % Reduction in Volume: 24.50% N/A N/A Classification: Full Thickness Without Exposed N/A N/A Support Structures Exudate Amount: Medium N/A N/A Exudate Type: Sanguinous N/A N/A Exudate Color: red N/A N/A Granulation Amount: Large (67-100%) N/A N/A Granulation Quality: Red, Hyper-granulation, Friable N/A  N/A Necrotic Amount: None Present (0%) N/A N/A Exposed Structures: Fat Layer (Subcutaneous Tissue): N/A N/A Yes Fascia: No Tendon: No Muscle: No Joint: No Bone: No Epithelialization: None N/A N/A Treatment Notes Electronic Signature(s) Signed: 11/06/2021 4:00:18 PM By: Betha Loa Entered By: Betha Loa on 11/06/2021 13:10:53 [name] ([id number]) -------------------------------------------------------------------------------- Multi-Disciplinary Care Plan Details Patient Name: [name] Date of Service: 11/06/2021 12:30 PM Medical Record Number: [id number] Patient Account Number: [id number] Date of Birth/Sex: 02/13/1986 (35 y.o. M) Treating RN: Yevonne Pax Primary Care Taige Housman: Unknown Foley Other Clinician: Betha Loa Referring Izzak Fries: Unknown Foley Treating Cortni Tays/Extender: Tilda Franco in Treatment: 1 Active Inactive Wound/Skin Impairment Nursing Diagnoses: Knowledge deficit related to ulceration/compromised skin integrity Goals: Patient/caregiver will verbalize understanding of skin care regimen Date Initiated: 10/30/2021 Target Resolution Date: 11/30/2021 Goal Status: Active Ulcer/skin breakdown will have a volume reduction of 30% by week 4 Date Initiated: 10/30/2021 Target Resolution Date: 11/30/2021 Goal Status: Active Ulcer/skin breakdown will have a volume reduction of 50% by week 8 Date Initiated: 10/30/2021 Target Resolution Date: 12/31/2021 Goal Status: Active Ulcer/skin breakdown will have a volume reduction of 80% by week 12 Date Initiated: 10/30/2021 Target Resolution Date: 01/14/2022 Goal Status: Active Ulcer/skin breakdown will heal within 14 weeks Date Initiated: 10/30/2021 Target Resolution Date: 03/02/2022 Goal Status: Active Interventions: Assess patient/caregiver ability to obtain necessary supplies Assess patient/caregiver ability to perform ulcer/skin care regimen upon admission and as needed Assess ulceration(s)  every visit Notes: Electronic Signature(s) Signed: 11/06/2021 4:00:18 PM By: Betha Loa Signed: 11/11/2021 8:34:39 AM By: Yevonne Pax RN Entered By: Betha Loa on 11/06/2021 13:10:35 [name] ([id number]) -------------------------------------------------------------------------------- Pain Assessment Details Patient Name: [name] Date of Service: 11/06/2021 12:30 PM Medical Record Number: [id number] Patient Account Number: [id number] Date of Birth/Sex: 1985-08-10 (35 y.o. M) Treating RN: Yevonne Pax Primary Care Jakeel Starliper: Unknown Foley Other Clinician: Betha Loa Referring Neils Siracusa: Unknown Foley Treating Kenn Rekowski/Extender: Tilda Franco in Treatment: 1 Active Problems Location of Pain Severity and Description of Pain Patient Has Paino No Site Locations Pain Management and Medication Current Pain Management: Electronic Signature(s) Signed: 11/06/2021 4:00:18 PM By: Betha Loa Signed: 11/11/2021 8:34:39 AM By: Yevonne Pax RN Entered By: Betha Loa on 11/06/2021 12:50:05 [name] ([id number]) -------------------------------------------------------------------------------- Patient/Caregiver Education Details Patient Name: [name] Date of Service: 11/06/2021 12:30 PM Medical Record Number: [id number] Patient Account Number: [id number] Date of Birth/Gender: 1986-03-24 (35 y.o. M) Treating RN: Yevonne Pax Primary Care Physician: Unknown Foley Other Clinician: Betha Loa Referring Physician: Christean Grief,  Robin Treating Physician/Extender: Tilda Franco in Treatment: 1 Education Assessment Education Provided To: Patient and Caregiver Education Topics Provided Wound/Skin Impairment: Handouts: Other: continue wound care as directed Electronic Signature(s) Signed: 11/06/2021 4:00:18 PM By: Betha Loa Entered By: Betha Loa on 11/06/2021 13:38:47 [name]  ([id number]) -------------------------------------------------------------------------------- Wound Assessment Details Patient Name: [name] Date of Service: 11/06/2021 12:30 PM Medical Record Number: [id number] Patient Account Number: [id number] Date of Birth/Sex: October 21, 1985 (35 y.o. M) Treating RN: Yevonne Pax Primary Care Lorence Nagengast: Unknown Foley Other Clinician: Betha Loa Referring Christle Nolting: Unknown Foley Treating Claborn Janusz/Extender: Tilda Franco in Treatment: 1 Wound Status Wound Number: 3 Primary Etiology: Atypical Wound Location: Left Upper Leg Wound Status: Open Wounding Event: Other Lesion Comorbid History: Quadriplegia Date Acquired: 08/05/2021 Weeks Of Treatment: 1 Clustered Wound: No Photos Wound Measurements Length: (cm) 13.2 Width: (cm) 8 Depth: (cm) 0.1 Area: (cm) 82.938 Volume: (cm) 8.294 % Reduction in Area: 28.9% % Reduction in Volume: 28.9% Epithelialization: None Wound Description Classification: Full Thickness Without Exposed Support Structures Exudate Amount: Medium Exudate Type: Sanguinous Exudate Color: red Foul Odor After Cleansing: No Slough/Fibrino No Wound Bed Granulation Amount: Large (67-100%) Exposed Structure Granulation Quality: Hyper-granulation, Friable Fascia Exposed: No Necrotic Amount: None Present (0%) Fat Layer (Subcutaneous Tissue) Exposed: Yes Tendon Exposed: No Muscle Exposed: No Joint Exposed: No Bone Exposed: No Treatment Notes Wound #3 (Upper Leg) Wound Laterality: Left Cleanser Wound Cleanser Discharge Instruction: Wash your hands with soap and water. Remove old dressing, discard into plastic bag and place into trash. Cleanse the wound with Wound Cleanser prior to applying a clean dressing using gauze sponges, not tissues or cotton balls. Do not scrub or use excessive force. Pat dry using gauze sponges, not tissue or cotton balls. AMOR, PACKARD ([id number]) Peri-Wound Care Topical Triamcinolone  Acetonide Cream, 0.1%, 15 (g) tube Discharge Instruction: Apply as directed by Lazaro Isenhower. Nystatin Cream, 30 (g) tube Discharge Instruction: in office spray at home Primary Dressing Secondary Dressing ABD Pad 5x9 (in/in) Discharge Instruction: Cover with ABD pad Secured With Medipore Tape - 96M Medipore H Soft Cloth Surgical Tape, 2x2 (in/yd) Compression Wrap Compression Stockings Add-Ons Electronic Signature(s) Signed: 11/06/2021 4:00:18 PM By: Betha Loa Signed: 11/11/2021 8:34:39 AM By: Yevonne Pax RN Entered By: Betha Loa on 11/06/2021 13:06:30 [name] ([id number]) -------------------------------------------------------------------------------- Wound Assessment Details Patient Name: [name] Date of Service: 11/06/2021 12:30 PM Medical Record Number: [id number] Patient Account Number: [id number] Date of Birth/Sex: 1985/11/28 (35 y.o. M) Treating RN: Yevonne Pax Primary Care Ebrahim Deremer: Unknown Foley Other Clinician: Betha Loa Referring Undrea Archbold: Unknown Foley Treating Evonte Prestage/Extender: Tilda Franco in Treatment: 1 Wound Status Wound Number: 4 Primary Etiology: Atypical Wound Location: Left, Circumferential Lower Leg Wound Status: Open Wounding Event: Gradually Appeared Comorbid History: Quadriplegia Date Acquired: 08/05/2021 Weeks Of Treatment: 1 Clustered Wound: No Photos Wound Measurements Length: (cm) 18.5 Width: (cm) 16.5 Depth: (cm) 0.1 Area: (cm) 239.743 Volume: (cm) 23.974 % Reduction in Area: 52.9% % Reduction in Volume: 52.9% Epithelialization: None Wound Description Classification: Full Thickness Without Exposed Support Structures Exudate Amount: Medium Exudate Type: Sanguinous Exudate Color: red Foul Odor After Cleansing: No Slough/Fibrino No Wound Bed Granulation Amount: Large (67-100%) Exposed Structure Granulation Quality: Red, Hyper-granulation, Friable Fascia Exposed: No Fat Layer (Subcutaneous Tissue)  Exposed: Yes Tendon Exposed: No Muscle Exposed: No Joint Exposed: No Bone Exposed: No Treatment Notes Wound #4 (Lower Leg) Wound Laterality: Left, Circumferential Cleanser Wound Cleanser Discharge Instruction: Wash your hands with soap and water. Remove old dressing, discard into plastic bag and place  into trash. Cleanse the wound with Wound Cleanser prior to applying a clean dressing using gauze sponges, not tissues or cotton balls. Do not scrub or use excessive force. Pat dry using gauze sponges, not tissue or cotton balls. OSMEL, DYKSTRA ([id number]) Peri-Wound Care Topical Triamcinolone Acetonide Cream, 0.1%, 15 (g) tube Discharge Instruction: Apply as directed by Cruzito Standre. Nystatin Cream, 30 (g) tube Discharge Instruction: in office spray at home Primary Dressing Secondary Dressing Kerlix 4.5 x 4.1 (in/yd) Discharge Instruction: Apply Kerlix 4.5 x 4.1 (in/yd) as instructed Secured With Medipore Tape - 361M Medipore H Soft Cloth Surgical Tape, 2x2 (in/yd) Compression Wrap Compression Stockings Add-Ons Electronic Signature(s) Signed: 11/06/2021 4:00:18 PM By: Betha Loa Signed: 11/11/2021 8:34:39 AM By: Yevonne Pax RN Entered By: Betha Loa on 11/06/2021 13:07:16 [name] ([id number]) -------------------------------------------------------------------------------- Wound Assessment Details Patient Name: [name] Date of Service: 11/06/2021 12:30 PM Medical Record Number: [id number] Patient Account Number: [id number] Date of Birth/Sex: 1986-03-25 (35 y.o. M) Treating RN: Yevonne Pax Primary Care Pepe Mineau: Unknown Foley Other Clinician: Betha Loa Referring Zendaya Groseclose: Unknown Foley Treating Zaire Levesque/Extender: Tilda Franco in Treatment: 1 Wound Status Wound Number: 5 Primary Etiology: Atypical Wound Location: Anterior Foot Wound Status: Open Wounding Event: Gradually Appeared Comorbid History: Quadriplegia Date Acquired: 07/06/2021 Weeks Of  Treatment: 1 Clustered Wound: No Photos Wound Measurements Length: (cm) 0.6 Width: (cm) 1.4 Depth: (cm) 0.1 Area: (cm) 0.66 Volume: (cm) 0.066 % Reduction in Area: 97.2% % Reduction in Volume: 97.2% Epithelialization: None Wound Description Classification: Full Thickness Without Exposed Support Structures Exudate Amount: Medium Exudate Type: Sanguinous Exudate Color: red Foul Odor After Cleansing: No Slough/Fibrino Yes Wound Bed Granulation Amount: Small (1-33%) Exposed Structure Granulation Quality: Red, Hyper-granulation, Friable Fascia Exposed: No Necrotic Amount: Large (67-100%) Fat Layer (Subcutaneous Tissue) Exposed: Yes Necrotic Quality: Adherent Slough Tendon Exposed: No Muscle Exposed: No Joint Exposed: No Bone Exposed: No Treatment Notes Wound #5 (Foot) Wound Laterality: Anterior Cleanser Wound Cleanser Discharge Instruction: Wash your hands with soap and water. Remove old dressing, discard into plastic bag and place into trash. Cleanse the wound with Wound Cleanser prior to applying a clean dressing using gauze sponges, not tissues or cotton balls. Do not scrub or use excessive force. Pat dry using gauze sponges, not tissue or cotton balls. DEVYN, SHEERIN ([id number]) Peri-Wound Care Topical Triamcinolone Acetonide Cream, 0.1%, 15 (g) tube Discharge Instruction: Apply as directed by Moiz Ryant. Nystatin Cream, 30 (g) tube Discharge Instruction: in office spray at home Primary Dressing Secondary Dressing Kerlix 4.5 x 4.1 (in/yd) Discharge Instruction: Apply Kerlix 4.5 x 4.1 (in/yd) as instructed Secured With Medipore Tape - 361M Medipore H Soft Cloth Surgical Tape, 2x2 (in/yd) Compression Wrap Compression Stockings Add-Ons Electronic Signature(s) Signed: 11/06/2021 4:00:18 PM By: Betha Loa Signed: 11/11/2021 8:34:39 AM By: Yevonne Pax RN Entered By: Betha Loa on 11/06/2021 13:07:47 [name]  ([id number]) -------------------------------------------------------------------------------- Wound Assessment Details Patient Name: [name] Date of Service: 11/06/2021 12:30 PM Medical Record Number: [id number] Patient Account Number: [id number] Date of Birth/Sex: 09/28/1985 (35 y.o. M) Treating RN: Yevonne Pax Primary Care Brenn Gatton: Unknown Foley Other Clinician: Betha Loa Referring Shakaria Raphael: Unknown Foley Treating Shamel Galyean/Extender: Tilda Franco in Treatment: 1 Wound Status Wound Number: 6 Primary Etiology: Atypical Wound Location: Back Wound Status: Open Wounding Event: Gradually Appeared Comorbid History: Quadriplegia Date Acquired: 07/06/2021 Weeks Of Treatment: 1 Clustered Wound: No Photos Wound Measurements Length: (cm) 24.1 Width: (cm) 31 Depth: (cm) 0.1 Area: (cm) 586.771 Volume: (cm) 58.677 % Reduction in Area: 24.5% % Reduction in Volume: 24.5% Epithelialization:  None Wound Description Classification: Full Thickness Without Exposed Support Structures Exudate Amount: Medium Exudate Type: Sanguinous Exudate Color: red Foul Odor After Cleansing: No Slough/Fibrino No Wound Bed Granulation Amount: Large (67-100%) Exposed Structure Granulation Quality: Red, Hyper-granulation, Friable Fascia Exposed: No Necrotic Amount: None Present (0%) Fat Layer (Subcutaneous Tissue) Exposed: Yes Tendon Exposed: No Muscle Exposed: No Joint Exposed: No Bone Exposed: No Treatment Notes Wound #6 (Back) Cleanser Wound Cleanser Discharge Instruction: Wash your hands with soap and water. Remove old dressing, discard into plastic bag and place into trash. Cleanse the wound with Wound Cleanser prior to applying a clean dressing using gauze sponges, not tissues or cotton balls. Do not scrub or use excessive force. Pat dry using gauze sponges, not tissue or cotton balls. AZRIEL, JAKOB ([id number]) Peri-Wound Care Topical Triamcinolone Acetonide Cream, 0.1%,  15 (g) tube Discharge Instruction: Apply as directed by Breon Diss. Nystatin Cream, 30 (g) tube Discharge Instruction: in office spray at home Primary Dressing Secondary Dressing Gauze Discharge Instruction: As directed: dry, moistened with saline or moistened with Dakins Solution Secured With Medipore Tape - 6M Medipore H Soft Cloth Surgical Tape, 2x2 (in/yd) Compression Wrap Compression Stockings Add-Ons Electronic Signature(s) Signed: 11/06/2021 4:00:18 PM By: Betha Loa Signed: 11/11/2021 8:34:39 AM By: Yevonne Pax RN Entered By: Betha Loa on 11/06/2021 13:08:23 [name] ([id number]) -------------------------------------------------------------------------------- Vitals Details Patient Name: [name] Date of Service: 11/06/2021 12:30 PM Medical Record Number: [id number] Patient Account Number: [id number] Date of Birth/Sex: 27-Aug-1985 (35 y.o. M) Treating RN: Yevonne Pax Primary Care Zanai Mallari: Unknown Foley Other Clinician: Betha Loa Referring Rhyan Radler: Unknown Foley Treating Alexei Ey/Extender: Tilda Franco in Treatment: 1 Vital Signs Time Taken: 12:48 Temperature (F): 98.0 Height (in): 73 Pulse (bpm): 78 Weight (lbs): 185 Respiratory Rate (breaths/min): 16 Body Mass Index (BMI): 24.4 Blood Pressure (mmHg): 101/69 Reference Range: 80 - 120 mg / dl Electronic Signature(s) Signed: 11/06/2021 4:00:18 PM By: Betha Loa Entered By: Betha Loa on 11/06/2021 12:49:45

## 2021-11-13 NOTE — Progress Notes (Signed)
[name], [name] ([id number]) Visit Report for 10/30/2021 Chief Complaint Document Details Patient Name: [name], [name] Date of Service: 10/30/2021 12:45 PM Medical Record Number: [id number] Patient Account Number: [id number] Date of Birth/Sex: 07-Jul-1985 (36 y.o. M) Treating RN: Yevonne Pax Primary Care Provider: Unknown Foley Other Clinician: Referring Provider: Unknown Foley Treating Provider/Extender: Tilda Franco in Treatment: 0 Information Obtained from: Patient Chief Complaint 10/30/2021; left lower extremity wounds, back wounds Electronic Signature(s) Signed: 10/30/2021 1:46:17 PM By: Geralyn Corwin DO Entered By: Geralyn Corwin on 10/30/2021 13:26:18 [name] ([id number]) -------------------------------------------------------------------------------- HPI Details Patient Name: [name] Date of Service: 10/30/2021 12:45 PM Medical Record Number: [id number] Patient Account Number: [id number] Date of Birth/Sex: 04-17-1985 (35 y.o. M) Treating RN: Yevonne Pax Primary Care Provider: Unknown Foley Other Clinician: Referring Provider: Unknown Foley Treating Provider/Extender: Tilda Franco in Treatment: 0 History of Present Illness HPI Description: 03/18/2021 upon evaluation today patient presents for initial inspection here in the clinic concerning issues that he has been having with his back. Fortunately this seems to be related to multiple potential issues here which involve moisture, pressure, and I believe a fungal infection as well. In the past he seems to have responded to steroids topically. With that being said I am thinking that might still be something that could be helpful for him. I am going to see about setting this and to the pharmacy to get things started. I also think we may want to do a wound culture in order to evaluate for any possibilities there. 04/19/2021 upon evaluation today patient actually appears to be doing excellent in  regard to his back. He is actually making wonderful progress as far as this is concerned. I do not see any signs of infection at this time which is great news and overall I think that he is doing quite well. Admission 10/30/2021 [name] is a 36 year old male with a past medical history of paraplegia that presents with extensive wounds to his back and left leg. He was seen in our clinic earlier in the year for a similar presentation but limited to the back area. This was treated effectively with antifungal spray, triamcinolone cream and oral antibiotics. He again presents today with similar wounds to his back. Unfortunately he has also developed wounds to his left lower extremity. He states that the wounds started after he was casted for a broken leg. He has also developed wounds to the left upper thigh. He states he was sunburned to this area and subsequently developed wounds. He has currently been keeping the wounds covered. He denies systemic signs of infection. Electronic Signature(s) Signed: 10/30/2021 1:46:17 PM By: Geralyn Corwin DO Entered By: Geralyn Corwin on 10/30/2021 13:31:11 [name] ([id number]) -------------------------------------------------------------------------------- Physical Exam Details Patient Name: [name] Date of Service: 10/30/2021 12:45 PM Medical Record Number: [id number] Patient Account Number: [id number] Date of Birth/Sex: 14-Dec-1985 (35 y.o. M) Treating RN: Yevonne Pax Primary Care Provider: Unknown Foley Other Clinician: Referring Provider: Unknown Foley Treating Provider/Extender: Tilda Franco in Treatment: 0 Constitutional . Cardiovascular . Psychiatric . Notes Back: Multiple extensive open wounds with granulated tissue throughout. Left lower extremity: Extensive wounds throughout the leg with granulation tissue, hyper granulated areas. No surrounding soft tissue infection. Overall wounds do not appear  healthy. Electronic Signature(s) Signed: 10/30/2021 1:46:17 PM By: Geralyn Corwin DO Entered By: Geralyn Corwin on 10/30/2021 13:32:14 [name] ([id number]) -------------------------------------------------------------------------------- Physician Orders Details Patient Name: [name] Date of Service: 10/30/2021 12:45 PM Medical Record Number: [id number] Patient Account Number: [id number] Date  of Birth/Sex: [birth year] (35 y.o. 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 Bathing/ Shower/ Hygiene o May shower; gently cleanse wound with antibacterial soap, rinse and pat dry prior to dressing wounds Wound Treatment Wound #3 - Upper Leg Wound Laterality: Left Cleanser: Wound Cleanser 1 x Per Day/30 Days Discharge Instructions: Wash your hands with soap and water. Remove old dressing, discard into plastic bag and place into trash. Cleanse the wound with Wound Cleanser prior to applying a clean dressing using gauze sponges, not tissues or cotton balls. Do not scrub or use excessive force. Pat dry using gauze sponges, not tissue or cotton balls. Topical: Triamcinolone Acetonide Cream, 0.1%, 15 (g) tube 1 x Per Day/30 Days Discharge Instructions: Apply as directed by provider. Topical: Nystatin Cream, 30 (g) tube 1 x Per Day/30 Days Discharge Instructions: in office spray at home Secondary Dressing: ABD Pad 5x9 (in/in) 1 x Per Day/30  Days Discharge Instructions: Cover with ABD pad Secured With: Medipore Tape - 82M Medipore H Soft Cloth Surgical Tape, 2x2 (in/yd) 1 x Per Day/30 Days Wound #4 - Lower Leg Wound Laterality: Left, Circumferential Cleanser: Wound Cleanser 1 x Per Day/30 Days Discharge Instructions: Wash your hands with soap and water. Remove old dressing, discard into plastic bag and place into trash. Cleanse the wound with Wound Cleanser prior to applying a clean dressing using gauze sponges, not tissues or cotton balls. Do not scrub or use excessive force. Pat dry using gauze sponges, not tissue or cotton balls. Topical: Triamcinolone Acetonide Cream, 0.1%, 15 (g) tube 1 x Per Day/30 Days Discharge Instructions: Apply as directed by provider. Topical: Nystatin Cream, 30 (g) tube 1 x Per Day/30 Days Discharge Instructions: in office spray at home Secondary Dressing: Kerlix 4.5 x 4.1 (in/yd) 1 x Per Day/30 Days Discharge Instructions: Apply Kerlix 4.5 x 4.1 (in/yd) as instructed Secured With: Medipore Tape - 82M Medipore H Soft Cloth Surgical Tape, 2x2 (in/yd) 1 x Per Day/30 Days Wound #5 - Foot Wound Laterality: Anterior Cleanser: Wound Cleanser 1 x Per Day/30 Days [name], [name] ([id number]) Discharge Instructions: Wash your hands with soap and water. Remove old dressing, discard into plastic bag and place into trash. Cleanse the wound with Wound Cleanser prior to applying a clean dressing using gauze sponges, not tissues or cotton balls. Do not scrub or use excessive force. Pat dry using gauze sponges, not tissue or cotton balls. Topical: Triamcinolone Acetonide Cream, 0.1%, 15 (g) tube 1 x Per Day/30 Days Discharge Instructions: Apply as directed by provider. Topical: Nystatin Cream, 30 (g) tube 1 x Per Day/30 Days Discharge Instructions: in office spray at home Secondary Dressing: Kerlix 4.5 x 4.1 (in/yd) 1 x Per Day/30 Days Discharge Instructions: Apply Kerlix 4.5 x 4.1 (in/yd) as instructed Secured With:  Medipore Tape - 82M Medipore H Soft Cloth Surgical Tape, 2x2 (in/yd) 1 x Per Day/30 Days Wound #6 - Back Cleanser: Wound Cleanser 1 x Per Day/30 Days Discharge Instructions: Wash your hands with soap and water. Remove old dressing, discard into plastic bag and place into trash. Cleanse the  wound with Wound Cleanser prior to applying a clean dressing using gauze sponges, not tissues or cotton balls. Do not scrub or use excessive force. Pat dry using gauze sponges, not tissue or cotton balls. Topical: Triamcinolone Acetonide Cream, 0.1%, 15 (g) tube 1 x Per Day/30 Days Discharge Instructions: Apply as directed by provider. Topical: Nystatin Cream, 30 (g) tube 1 x Per Day/30 Days Discharge Instructions: in office spray at home Secondary Dressing: Gauze 1 x Per Day/30 Days Discharge Instructions: As directed: dry, moistened with saline or moistened with Dakins Solution Secured With: Medipore Tape - 38M Medipore H Soft Cloth Surgical Tape, 2x2 (in/yd) 1 x Per Day/30 Days Consults o Dermatology - lesions to leg and back - (ICD10 L97.829 - Non-pressure chronic ulcer of other part of left lower leg with unspecified severity) Patient Medications Allergies: No Known Allergies Notifications Medication Indication Start End triamcinolone acetonide 10/30/2021 DOSE 1 - topical 0.1 % cream - Apply 1-2 times daily amoxicillin-pot clavulanate 10/30/2021 DOSE 1 - oral 875 mg-125 mg tablet - 1 tablet oral BID x 14 days Electronic Signature(s) Signed: 10/31/2021 11:12:14 AM By: Yevonne Pax RN Signed: 11/13/2021 3:26:01 PM By: Geralyn Corwin DO Previous Signature: 10/30/2021 1:46:59 PM Version By: Yevonne Pax RN Previous Signature: 10/30/2021 3:12:10 PM Version By: Geralyn Corwin DO Previous Signature: 10/30/2021 1:42:16 PM Version By: Geralyn Corwin DO Entered By: Yevonne Pax on 10/31/2021 11:12:14 [name]  ([id number]) -------------------------------------------------------------------------------- Problem List Details Patient Name: [name] Date of Service: 10/30/2021 12:45 PM Medical Record Number: [id number] Patient Account Number: [id number] Date of Birth/Sex: 1985/04/10 (35 y.o. 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 10/30/2021 No Yes L97.829 Non-pressure chronic ulcer of other part of left lower leg with 10/30/2021 No Yes unspecified severity B35.4 Tinea corporis 10/30/2021 No Yes L98.8 Other specified disorders of the skin and subcutaneous tissue 10/30/2021 No Yes G82.22 Paraplegia, incomplete 10/30/2021 No Yes Inactive Problems Resolved Problems Electronic Signature(s) Signed: 10/30/2021 1:46:17 PM By: Geralyn Corwin DO Entered By: Geralyn Corwin on 10/30/2021 13:26:06 [name] ([id number]) -------------------------------------------------------------------------------- Progress Note Details Patient Name: [name] Date of Service: 10/30/2021 12:45 PM Medical Record Number: [id number] Patient Account Number: [id number] Date of Birth/Sex: 1986-02-02 (35 y.o. M) Treating RN: Yevonne Pax Primary Care Provider: Unknown Foley Other Clinician: Referring Provider: Unknown Foley Treating Provider/Extender: Tilda Franco in Treatment: 0 Subjective Chief Complaint Information obtained from Patient 10/30/2021; left lower extremity wounds, back wounds History of Present Illness (HPI) 03/18/2021 upon evaluation today patient presents for initial inspection here in the clinic concerning issues that he has been having with his back. Fortunately this seems to be related to  multiple potential issues here which involve moisture, pressure, and I believe a fungal infection as well. In the past he seems to have responded to steroids topically. With that being said I am thinking that might still be something that could be helpful for him. I am going to see about setting this and to the pharmacy to get things started. I also think we may want to do a wound culture in order to evaluate for any possibilities there. 04/19/2021 upon evaluation today patient actually appears to be doing excellent in regard to his back. He is actually making wonderful progress as far as this is concerned. I do not see any signs  of infection at this time which is great news and overall I think that he is doing quite well. Admission 10/30/2021 Mr. Elizandro Laura is a 36 year old male with a past medical history of paraplegia that presents with extensive wounds to his back and left leg. He was seen in our clinic earlier in the year for a similar presentation but limited to the back area. This was treated effectively with antifungal spray, triamcinolone cream and oral antibiotics. He again presents today with similar wounds to his back. Unfortunately he has also developed wounds to his left lower extremity. He states that the wounds started after he was casted for a broken leg. He has also developed wounds to the left upper thigh. He states he was sunburned to this area and subsequently developed wounds. He has currently been keeping the wounds covered. He denies systemic signs of infection. Patient History Information obtained from Patient. Allergies No Known Allergies Social History Former smoker, Marital Status - Divorced, Alcohol Use - Never, Drug Use - Current History - CBD, Caffeine Use - Daily. Medical History Neurologic Patient has history of Quadriplegia Objective Constitutional Vitals Time Taken: 12:42 PM, Height: 73 in, Source: Stated, Weight: 185 lbs, Source: Stated, BMI: 24.4,  Temperature: 98 F, Pulse: 111 bpm, Respiratory Rate: 18 breaths/min, Blood Pressure: 110/77 mmHg. General Notes: Back: Multiple extensive open wounds with granulated tissue throughout. Left lower extremity: Extensive wounds throughout the leg with granulation tissue, hyper granulated areas. No surrounding soft tissue infection. Overall wounds do not appear healthy. Integumentary (Hair, Skin) Wound #3 status is Open. Original cause of wound was Other Lesion. The date acquired was: 08/05/2021. The wound is located on the Left Upper Leg. The wound measures 16.5cm length x 9cm width x 0.1cm depth; 116.632cm^2 area and 11.663cm^3 volume. There is Fat Layer (Subcutaneous Tissue) exposed. There is no tunneling or undermining noted. There is a medium amount of sanguinous drainage noted. There is large (67-100%) friable, hyper - granulation within the wound bed. There is no necrotic tissue within the wound bed. [name], [name] ([id number]) Wound #4 status is Open. Original cause of wound was Gradually Appeared. The date acquired was: 08/05/2021. The wound is located on the Left,Circumferential Lower Leg. The wound measures 27cm length x 24cm width x 0.1cm depth; 508.938cm^2 area and 50.894cm^3 volume. There is Fat Layer (Subcutaneous Tissue) exposed. There is no tunneling or undermining noted. There is a medium amount of sanguinous drainage noted. There is large (67-100%) red, friable, hyper - granulation within the wound bed. Wound #5 status is Open. Original cause of wound was Gradually Appeared. The date acquired was: 07/06/2021. The wound is located on the Anterior Foot. The wound measures 5cm length x 6cm width x 0.1cm depth; 23.562cm^2 area and 2.356cm^3 volume. There is Fat Layer (Subcutaneous Tissue) exposed. There is no tunneling or undermining noted. There is a medium amount of sanguinous drainage noted. There is small (1-33%) red, friable, hyper - granulation within the wound bed. There is a large  (67-100%) amount of necrotic tissue within the wound bed including Adherent Slough. Wound #6 status is Open. Original cause of wound was Gradually Appeared. The date acquired was: 07/06/2021. The wound is located on the Back. The wound measures 33cm length x 30cm width x 0.1cm depth; 777.544cm^2 area and 77.754cm^3 volume. There is Fat Layer (Subcutaneous Tissue) exposed. There is no tunneling or undermining noted. There is a medium amount of sanguinous drainage noted. There is large (67-100%) red, friable, hyper - granulation within the wound bed.  There is no necrotic tissue within the wound bed. 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 He has a history of sensitive skin and when there is skin breakdown he develops extensive wounds. Previously it was thought that the back wounds were caused by moisture retention leading to skin breakdown. Today's presentation looks very similar to when he was seen in January. He has now also developed wounds to his left lower extremity. These happened after he had cast placement for a broken leg. The cast has been stopped and he is currently in a cam boot. His Previous back wounds have been treated successfully in the past with Augmentin, antifungal spray and triamcinolone cream. We will go ahead and do the same treatment today. Also recommended aggressive offloading. If this treatment does not show improvement he will need a biopsy. I will refer to dermatology today. Plan Bathing/ Shower/ Hygiene: May shower; gently cleanse wound with antibacterial soap, rinse and pat dry prior to dressing wounds The following medication(s) was  prescribed: triamcinolone acetonide topical 0.1 % cream 1 Apply 1-2 times daily starting 10/30/2021 amoxicillin-pot clavulanate oral 875 mg-125 mg tablet 1 1 tablet oral BID x 14 days starting 10/30/2021 WOUND #3: - Upper Leg Wound Laterality: Left Cleanser: Wound Cleanser 1 x Per Day/30 Days Discharge Instructions: Wash your hands with soap and water. Remove old dressing, discard into plastic bag and place into trash. Cleanse the wound with Wound Cleanser prior to applying a clean dressing using gauze sponges, not tissues or cotton balls. Do not scrub or use excessive force. Pat dry using gauze sponges, not tissue or cotton balls. Topical: Triamcinolone Acetonide Cream, 0.1%, 15 (g) tube 1 x Per Day/30 Days Discharge Instructions: Apply as directed by provider. Topical: Nystatin Cream, 30 (g) tube 1 x Per Day/30 Days Discharge Instructions: in office spray at home Secondary Dressing: ABD Pad 5x9 (in/in) 1 x Per Day/30 Days Discharge Instructions: Cover with ABD pad Secured With: Medipore Tape - 37M Medipore H Soft Cloth Surgical Tape, 2x2 (in/yd) 1 x Per Day/30 Days WOUND #4: - Lower Leg Wound Laterality: Left, Circumferential Cleanser: Wound Cleanser 1 x Per Day/30 Days Discharge Instructions: Wash your hands with soap and water. Remove old dressing, discard into plastic bag and place into trash. Cleanse the wound with Wound Cleanser prior to applying a clean dressing using gauze sponges, not tissues or cotton balls. Do not scrub or use excessive force. Pat dry using gauze sponges, not tissue or cotton balls. Topical: Triamcinolone Acetonide Cream, 0.1%, 15 (g) tube 1 x Per Day/30 Days Discharge Instructions: Apply as directed by provider. Topical: Nystatin Cream, 30 (g) tube 1 x Per Day/30 Days Discharge Instructions: in office spray at home Secondary Dressing: Kerlix 4.5 x 4.1 (in/yd) 1 x Per Day/30 Days Discharge Instructions: Apply Kerlix 4.5 x 4.1 (in/yd) as instructed Secured With:  Medipore Tape - 37M Medipore H Soft Cloth Surgical Tape, 2x2 (in/yd) 1 x Per Day/30 Days WOUND #5: - Foot Wound Laterality: Anterior Cleanser: Wound Cleanser 1 x Per Day/30 Days [name], [name] ([id number]) Discharge Instructions: Wash your hands with soap and water. Remove old dressing, discard into plastic bag and place into trash. Cleanse the wound with Wound Cleanser prior to applying a  clean dressing using gauze sponges, not tissues or cotton balls. Do not scrub or use excessive force. Pat dry using gauze sponges, not tissue or cotton balls. Topical: Triamcinolone Acetonide Cream, 0.1%, 15 (g) tube 1 x Per Day/30 Days Discharge Instructions: Apply as directed by provider. Topical: Nystatin Cream, 30 (g) tube 1 x Per Day/30 Days Discharge Instructions: in office spray at home Secondary Dressing: Kerlix 4.5 x 4.1 (in/yd) 1 x Per Day/30 Days Discharge Instructions: Apply Kerlix 4.5 x 4.1 (in/yd) as instructed Secured With: Medipore Tape - 67M Medipore H Soft Cloth Surgical Tape, 2x2 (in/yd) 1 x Per Day/30 Days WOUND #6: - Back Wound Laterality: Cleanser: Wound Cleanser 1 x Per Day/30 Days Discharge Instructions: Wash your hands with soap and water. Remove old dressing, discard into plastic bag and place into trash. Cleanse the wound with Wound Cleanser prior to applying a clean dressing using gauze sponges, not tissues or cotton balls. Do not scrub or use excessive force. Pat dry using gauze sponges, not tissue or cotton balls. Topical: Triamcinolone Acetonide Cream, 0.1%, 15 (g) tube 1 x Per Day/30 Days Discharge Instructions: Apply as directed by provider. Topical: Nystatin Cream, 30 (g) tube 1 x Per Day/30 Days Discharge Instructions: in office spray at home Secondary Dressing: Gauze 1 x Per Day/30 Days Discharge Instructions: As directed: dry, moistened with saline or moistened with Dakins Solution Secured With: Medipore Tape - 67M Medipore H Soft Cloth Surgical Tape, 2x2 (in/yd) 1 x Per  Day/30 Days 1. Augmentin 2. Triamcinolone cream 3. Over-the-counter antifungal spray 4. Dermatology referral 5. Follow-up in 1 week Electronic Signature(s) Signed: 10/30/2021 1:46:17 PM By: Geralyn CorwinHoffman, Zakhai Meisinger DO Entered By: Geralyn CorwinHoffman, Deklynn Charlet on 10/30/2021 13:45:17 [name], Collie ([id number]) -------------------------------------------------------------------------------- ROS/PFSH Details Patient Name: [name], [name] Date of Service: 10/30/2021 12:45 PM Medical Record Number: [id number] Patient Account Number: [id number] Date of Birth/Sex: [birth year] (35 y.o. M) Treating RN: Yevonne PaxEpps, Carrie Primary Care Provider: Unknown FoleyWhitten, Robin Other Clinician: Referring Provider: Unknown FoleyWhitten, Robin Treating Provider/Extender: Tilda FrancoHoffman, Ieasha Boerema Weeks in Treatment: 0 Information Obtained From Patient Neurologic Medical History: Positive for: Quadriplegia Immunizations Pneumococcal Vaccine: Received Pneumococcal Vaccination: No Implantable Devices None Family and Social History Former smoker; Marital Status - Divorced; Alcohol Use: Never; Drug Use: Current History - CBD; Caffeine Use: Daily; Financial Concerns: No; Food, Clothing or Shelter Needs: No; Support System Lacking: No; Transportation Concerns: No Electronic Signature(s) Signed: 10/30/2021 1:46:17 PM By: Geralyn CorwinHoffman, Priseis Cratty DO Signed: 10/31/2021 4:52:51 PM By: Yevonne PaxEpps, Carrie RN Entered By: Yevonne PaxEpps, Carrie on 10/30/2021 12:43:54 [name], [name] ([id number]) -------------------------------------------------------------------------------- SuperBill Details Patient Name: [name], [name] Date of Service: 10/30/2021 Medical Record Number: [id number] Patient Account Number: [id number] Date of Birth/Sex: [birth year] (35 y.o. 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 L97.829 Non-pressure chronic ulcer of other part of left lower leg with unspecified severity B35.4 Tinea corporis L98.8 Other specified disorders of the skin and subcutaneous tissue G82.22 Paraplegia, incomplete Facility Procedures CPT4 Code: [id number] Description: [id number] - WOUND CARE VISIT-LEV 5 EST PT Modifier: Quantity: 1 Physician Procedures CPT4 Code: 9518841: [id number] Description: 99214 - WC PHYS LEVEL 4 - EST PT Modifier: Quantity: 1 CPT4 Code: Description: ICD-10 Diagnosis Description L98.422 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

## 2021-11-20 ENCOUNTER — Encounter (HOSPITAL_BASED_OUTPATIENT_CLINIC_OR_DEPARTMENT_OTHER): Payer: Medicare Other | Admitting: Internal Medicine

## 2021-11-20 DIAGNOSIS — L988 Other specified disorders of the skin and subcutaneous tissue: Secondary | ICD-10-CM | POA: Diagnosis not present

## 2021-11-20 DIAGNOSIS — L98422 Non-pressure chronic ulcer of back with fat layer exposed: Secondary | ICD-10-CM

## 2021-11-20 DIAGNOSIS — L97829 Non-pressure chronic ulcer of other part of left lower leg with unspecified severity: Secondary | ICD-10-CM | POA: Diagnosis not present

## 2021-11-20 DIAGNOSIS — B354 Tinea corporis: Secondary | ICD-10-CM

## 2021-11-22 NOTE — Progress Notes (Signed)
GEOVONNIE, KOONZ ([id number]) Visit Report for 11/20/2021 Arrival Information Details Patient Name: [name] Date of Service: 11/20/2021 2:30 PM Medical Record Number: [id number] Patient Account Number: [id number] Date of Birth/Sex: Oct 12, 1985 (36 y.o. M) Treating RN: Cornell Barman Primary Care Serenity Fortner: August Luz Other Clinician: Massie Kluver Referring Yaretzy Olazabal: August Luz Treating Samhita Kretsch/Extender: Yaakov Guthrie in Treatment: 3 Visit Information History Since Last Visit All ordered tests and consults were completed: No Patient Arrived: Wheel Chair Added or deleted any medications: No Arrival Time: 14:37 Any new allergies or adverse reactions: No Transfer Assistance: None Had a fall or experienced change in No Patient Requires Transmission-Based Precautions: No activities of daily living that may affect Patient Has Alerts: No risk of falls: Hospitalized since last visit: No Pain Present Now: No Electronic Signature(s) Signed: 11/20/2021 4:16:15 PM By: Massie Kluver Entered By: Massie Kluver on 11/20/2021 14:45:41 [name] ([id number]) -------------------------------------------------------------------------------- Clinic Level of Care Assessment Details Patient Name: [name] Date of Service: 11/20/2021 2:30 PM Medical Record Number: [id number] Patient Account Number: [id number] Date of Birth/Sex: [birth year] (36 y.o. M) Treating RN: Cornell Barman Primary Care Rhina Kramme: August Luz Other Clinician: Massie Kluver Referring Romell Wolden: August Luz Treating Ethelyne Erich/Extender: Yaakov Guthrie in Treatment: 3 Clinic Level of Care Assessment Items TOOL 4 Quantity Score []  - Use when only an EandM is performed on FOLLOW-UP visit 0 ASSESSMENTS - Nursing Assessment / Reassessment X - Reassessment of Co-morbidities (includes updates in patient status) 1 10 X- 1 5 Reassessment of Adherence to Treatment Plan ASSESSMENTS - Wound and Skin  Assessment / Reassessment []  - Simple Wound Assessment / Reassessment - one wound 0 X- 4 5 Complex Wound Assessment / Reassessment - multiple wounds []  - 0 Dermatologic / Skin Assessment (not related to wound area) ASSESSMENTS - Focused Assessment []  - Circumferential Edema Measurements - multi extremities 0 []  - 0 Nutritional Assessment / Counseling / Intervention []  - 0 Lower Extremity Assessment (monofilament, tuning fork, pulses) []  - 0 Peripheral Arterial Disease Assessment (using hand held doppler) ASSESSMENTS - Ostomy and/or Continence Assessment and Care []  - Incontinence Assessment and Management 0 []  - 0 Ostomy Care Assessment and Management (repouching, etc.) PROCESS - Coordination of Care X - Simple Patient / Family Education for ongoing care 1 15 []  - 0 Complex (extensive) Patient / Family Education for ongoing care []  - 0 Staff obtains Programmer, systems, Records, Test Results / Process Orders []  - 0 Staff telephones HHA, Nursing Homes / Clarify orders / etc []  - 0 Routine Transfer to another Facility (non-emergent condition) []  - 0 Routine Hospital Admission (non-emergent condition) []  - 0 New Admissions / Biomedical engineer / Ordering NPWT, Apligraf, etc. []  - 0 Emergency Hospital Admission (emergent condition) X- 1 10 Simple Discharge Coordination []  - 0 Complex (extensive) Discharge Coordination PROCESS - Special Needs []  - Pediatric / Minor Patient Management 0 []  - 0 Isolation Patient Management []  - 0 Hearing / Language / Visual special needs []  - 0 Assessment of Community assistance (transportation, D/C planning, etc.) []  - 0 Additional assistance / Altered mentation []  - 0 Support Surface(s) Assessment (bed, cushion, seat, etc.) INTERVENTIONS - Wound Cleansing / Measurement Ladson, [name] ([id number]) []  - 0 Simple Wound Cleansing - one wound X- 4 5 Complex Wound Cleansing - multiple wounds X- 1 5 Wound Imaging (photographs - any number of  wounds) []  - 0 Wound Tracing (instead of photographs) []  - 0 Simple Wound Measurement - one wound X- 4 5 Complex Wound Measurement - multiple wounds INTERVENTIONS -  Wound Dressings []  - Small Wound Dressing one or multiple wounds 0 X- 1 15 Medium Wound Dressing one or multiple wounds []  - 0 Large Wound Dressing one or multiple wounds X- 1 5 Application of Medications - topical []  - 0 Application of Medications - injection INTERVENTIONS - Miscellaneous []  - External ear exam 0 []  - 0 Specimen Collection (cultures, biopsies, blood, body fluids, etc.) []  - 0 Specimen(s) / Culture(s) sent or taken to Lab for analysis []  - 0 Patient Transfer (multiple staff / Civil Service fast streamer / Similar devices) []  - 0 Simple Staple / Suture removal (25 or less) []  - 0 Complex Staple / Suture removal (26 or more) []  - 0 Hypo / Hyperglycemic Management (close monitor of Blood Glucose) []  - 0 Ankle / Brachial Index (ABI) - do not check if billed separately X- 1 5 Vital Signs Has the patient been seen at the hospital within the last three years: Yes Total Score: 130 Level Of Care: New/Established - Level 4 Electronic Signature(s) Signed: 11/20/2021 4:16:15 PM By: Massie Kluver Entered By: Massie Kluver on 11/20/2021 15:15:42 [name] ([id number]) -------------------------------------------------------------------------------- Encounter Discharge Information Details Patient Name: [name] Date of Service: 11/20/2021 2:30 PM Medical Record Number: [id number] Patient Account Number: [id number] Date of Birth/Sex: 1986-01-08 (36 y.o. M) Treating RN: Cornell Barman Primary Care Marielys Trinidad: August Luz Other Clinician: Massie Kluver Referring Jaz Mallick: August Luz Treating Archita Lomeli/Extender: Yaakov Guthrie in Treatment: 3 Encounter Discharge Information Items Discharge Condition: Stable Ambulatory Status: Wheelchair Discharge Destination: Home Transportation: Private  Auto Accompanied By: mother Schedule Follow-up Appointment: Yes Clinical Summary of Care: Electronic Signature(s) Signed: 11/20/2021 4:16:15 PM By: Massie Kluver Entered By: Massie Kluver on 11/20/2021 15:32:07 [name] ([id number]) -------------------------------------------------------------------------------- Lower Extremity Assessment Details Patient Name: [name] Date of Service: 11/20/2021 2:30 PM Medical Record Number: [id number] Patient Account Number: [id number] Date of Birth/Sex: 19-Jun-1985 (35 y.o. M) Treating RN: Cornell Barman Primary Care Sheralee Qazi: August Luz Other Clinician: Massie Kluver Referring Norris Bodley: August Luz Treating Simran Bomkamp/Extender: Yaakov Guthrie in Treatment: 3 Electronic Signature(s) Signed: 11/20/2021 4:16:15 PM By: Massie Kluver Signed: 11/21/2021 7:54:04 AM By: Gretta Cool, BSN, RN, CWS, Kim RN, BSN Entered By: Massie Kluver on 11/20/2021 15:09:49 [name] ([id number]) -------------------------------------------------------------------------------- Multi Wound Chart Details Patient Name: [name] Date of Service: 11/20/2021 2:30 PM Medical Record Number: [id number] Patient Account Number: [id number] Date of Birth/Sex: [birth year] (35 y.o. M) Treating RN: Cornell Barman Primary Care Jayquon Theiler: August Luz Other Clinician: Massie Kluver Referring Gerod Caligiuri: August Luz Treating Donyale Falcon/Extender: Yaakov Guthrie in Treatment: 3 Vital Signs Height(in): 73 Pulse(bpm): 90 Weight(lbs): 185 Blood Pressure(mmHg): 109/74 Body Mass Index(BMI): 24.4 Temperature(F): 98.1 Respiratory Rate(breaths/min): 16 Photos: Wound Location: Left Upper Leg Left, Circumferential Lower Leg Anterior Foot Wounding Event: Other Lesion Gradually Appeared Gradually Appeared Primary Etiology: Atypical Atypical Atypical Comorbid History: Quadriplegia Quadriplegia Quadriplegia Date Acquired: 08/05/2021 08/05/2021 07/06/2021 Weeks of  Treatment: 3 3 3  Wound Status: Open Open Open Wound Recurrence: No No No Measurements L x W x D (cm) 0.1x0.1x0.1 7.8x1x0.1 3x4.8x0.1 Area (cm) : 0.008 6.126 11.31 Volume (cm) : 0.001 0.613 1.131 % Reduction in Area: 100.00% 98.80% 52.00% % Reduction in Volume: 100.00% 98.80% 52.00% Classification: Full Thickness Without Exposed Full Thickness Without Exposed Full Thickness Without Exposed Support Structures Support Structures Support Structures Exudate Amount: Medium Medium Medium Exudate Type: Sanguinous Sanguinous Sanguinous Exudate Color: red red red Granulation Amount: Large (67-100%) Large (67-100%) Small (1-33%) Granulation Quality: Hyper-granulation, Friable Red, Hyper-granulation, Friable Red, Hyper-granulation, Friable Necrotic Amount: None Present (0%) N/A  Large (67-100%) Exposed Structures: Fat Layer (Subcutaneous Tissue): Fat Layer (Subcutaneous Tissue): Fat Layer (Subcutaneous Tissue): Yes Yes Yes Fascia: No Fascia: No Fascia: No Tendon: No Tendon: No Tendon: No Muscle: No Muscle: No Muscle: No Joint: No Joint: No Joint: No Bone: No Bone: No Bone: No Epithelialization: None None None Wound Number: 6 N/A N/A Photos: RAYQUAN, VIAU ([id number]) N/A N/A Wound Location: Back N/A N/A Wounding Event: Gradually Appeared N/A N/A Primary Etiology: Atypical N/A N/A Comorbid History: Quadriplegia N/A N/A Date Acquired: 07/06/2021 N/A N/A Weeks of Treatment: 3 N/A N/A Wound Status: Open N/A N/A Wound Recurrence: No N/A N/A Measurements L x W x D (cm) 18x28.5x0.1 N/A N/A Area (cm) : 402.909 N/A N/A Volume (cm) : 40.291 N/A N/A % Reduction in Area: 48.20% N/A N/A % Reduction in Volume: 48.20% N/A N/A Classification: Full Thickness Without Exposed N/A N/A Support Structures Exudate Amount: Medium N/A N/A Exudate Type: Sanguinous N/A N/A Exudate Color: red N/A N/A Granulation Amount: Large (67-100%) N/A N/A Granulation Quality: Red, Hyper-granulation, Friable  N/A N/A Necrotic Amount: None Present (0%) N/A N/A Exposed Structures: Fat Layer (Subcutaneous Tissue): N/A N/A Yes Fascia: No Tendon: No Muscle: No Joint: No Bone: No Epithelialization: None N/A N/A Treatment Notes Electronic Signature(s) Signed: 11/20/2021 4:16:15 PM By: Massie Kluver Entered By: Massie Kluver on 11/20/2021 15:10:05 [name] ([id number]) -------------------------------------------------------------------------------- Multi-Disciplinary Care Plan Details Patient Name: [name] Date of Service: 11/20/2021 2:30 PM Medical Record Number: [id number] Patient Account Number: [id number] Date of Birth/Sex: 14-Jan-1986 (35 y.o. M) Treating RN: Cornell Barman Primary Care Ellie Spickler: August Luz Other Clinician: Massie Kluver Referring Kaedan Richert: August Luz Treating Keonda Dow/Extender: Yaakov Guthrie in Treatment: 3 Active Inactive Wound/Skin Impairment Nursing Diagnoses: Knowledge deficit related to ulceration/compromised skin integrity Goals: Patient/caregiver will verbalize understanding of skin care regimen Date Initiated: 10/30/2021 Target Resolution Date: 11/30/2021 Goal Status: Active Ulcer/skin breakdown will have a volume reduction of 30% by week 4 Date Initiated: 10/30/2021 Target Resolution Date: 11/30/2021 Goal Status: Active Ulcer/skin breakdown will have a volume reduction of 50% by week 8 Date Initiated: 10/30/2021 Target Resolution Date: 12/31/2021 Goal Status: Active Ulcer/skin breakdown will have a volume reduction of 80% by week 12 Date Initiated: 10/30/2021 Target Resolution Date: 01/14/2022 Goal Status: Active Ulcer/skin breakdown will heal within 14 weeks Date Initiated: 10/30/2021 Target Resolution Date: 03/02/2022 Goal Status: Active Interventions: Assess patient/caregiver ability to obtain necessary supplies Assess patient/caregiver ability to perform ulcer/skin care regimen upon admission and as needed Assess  ulceration(s) every visit Notes: Electronic Signature(s) Signed: 11/20/2021 4:16:15 PM By: Massie Kluver Signed: 11/21/2021 7:54:04 AM By: Gretta Cool, BSN, RN, CWS, Kim RN, BSN Entered By: Massie Kluver on 11/20/2021 15:09:56 [name] ([id number]) -------------------------------------------------------------------------------- Pain Assessment Details Patient Name: [name] Date of Service: 11/20/2021 2:30 PM Medical Record Number: [id number] Patient Account Number: [id number] Date of Birth/Sex: Jun 23, 1985 (35 y.o. M) Treating RN: Cornell Barman Primary Care Chandlor Noecker: August Luz Other Clinician: Massie Kluver Referring Can Lucci: August Luz Treating Chevelle Durr/Extender: Yaakov Guthrie in Treatment: 3 Active Problems Location of Pain Severity and Description of Pain Patient Has Paino No Site Locations Pain Management and Medication Current Pain Management: Electronic Signature(s) Signed: 11/20/2021 4:16:15 PM By: Massie Kluver Signed: 11/21/2021 7:54:04 AM By: Gretta Cool, BSN, RN, CWS, Kim RN, BSN Entered By: Massie Kluver on 11/20/2021 14:47:43 [name] ([id number]) -------------------------------------------------------------------------------- Patient/Caregiver Education Details Patient Name: [name] Date of Service: 11/20/2021 2:30 PM Medical Record Number: [id number] Patient Account Number: [id number] Date of Birth/Gender: 02/10/1986 (35 y.o. M) Treating RN: Cornell Barman  Primary Care Physician: Unknown Foley Other Clinician: Betha Loa Referring Physician: Unknown Foley Treating Physician/Extender: Tilda Franco in Treatment: 3 Education Assessment Education Provided To: Patient Education Topics Provided Wound/Skin Impairment: Handouts: Other: continue wound care as directed Electronic Signature(s) Signed: 11/20/2021 4:16:15 PM By: Betha Loa Entered By: Betha Loa on 11/20/2021 15:16:03 [name]  ([id number]) -------------------------------------------------------------------------------- Wound Assessment Details Patient Name: [name] Date of Service: 11/20/2021 2:30 PM Medical Record Number: [id number] Patient Account Number: [id number] Date of Birth/Sex: Oct 03, 1985 (35 y.o. M) Treating RN: Huel Coventry Primary Care Nikhil Osei: Unknown Foley Other Clinician: Betha Loa Referring Keileigh Vahey: Unknown Foley Treating Shacoya Burkhammer/Extender: Tilda Franco in Treatment: 3 Wound Status Wound Number: 3 Primary Etiology: Atypical Wound Location: Left Upper Leg Wound Status: Open Wounding Event: Other Lesion Comorbid History: Quadriplegia Date Acquired: 08/05/2021 Weeks Of Treatment: 3 Clustered Wound: No Photos Wound Measurements Length: (cm) 0.1 Width: (cm) 0.1 Depth: (cm) 0.1 Area: (cm) 0.008 Volume: (cm) 0.001 % Reduction in Area: 100% % Reduction in Volume: 100% Epithelialization: None Tunneling: No Undermining: No Wound Description Classification: Full Thickness Without Exposed Support Structures Exudate Amount: Medium Exudate Type: Sanguinous Exudate Color: red Foul Odor After Cleansing: No Slough/Fibrino No Wound Bed Granulation Amount: Large (67-100%) Exposed Structure Granulation Quality: Hyper-granulation, Friable Fascia Exposed: No Necrotic Amount: None Present (0%) Fat Layer (Subcutaneous Tissue) Exposed: Yes Tendon Exposed: No Muscle Exposed: No Joint Exposed: No Bone Exposed: No Treatment Notes Wound #3 (Upper Leg) Wound Laterality: Left Cleanser Wound Cleanser Discharge Instruction: Wash your hands with soap and water. Remove old dressing, discard into plastic bag and place into trash. Cleanse the wound with Wound Cleanser prior to applying a clean dressing using gauze sponges, not tissues or cotton balls. Do not scrub or use excessive force. Pat dry using gauze sponges, not tissue or cotton balls. JORIAN, WILLHOITE ([id number]) Peri-Wound  Care Topical Triamcinolone Acetonide Cream, 0.1%, 15 (g) tube Discharge Instruction: Apply as directed by Elishah Ashmore. Nystatin Cream, 30 (g) tube Discharge Instruction: in office spray at home Primary Dressing Secondary Dressing ABD Pad 5x9 (in/in) Discharge Instruction: Cover with ABD pad Secured With Medipore Tape - 7M Medipore H Soft Cloth Surgical Tape, 2x2 (in/yd) Compression Wrap Compression Stockings Add-Ons Electronic Signature(s) Signed: 11/20/2021 4:16:15 PM By: Betha Loa Signed: 11/21/2021 7:54:04 AM By: Elliot Gurney, BSN, RN, CWS, Kim RN, BSN Entered By: Betha Loa on 11/20/2021 15:05:21 [name] ([id number]) -------------------------------------------------------------------------------- Wound Assessment Details Patient Name: [name] Date of Service: 11/20/2021 2:30 PM Medical Record Number: [id number] Patient Account Number: [id number] Date of Birth/Sex: Jun 30, 1985 (35 y.o. M) Treating RN: Huel Coventry Primary Care Conita Amenta: Unknown Foley Other Clinician: Betha Loa Referring Gurjot Brisco: Unknown Foley Treating Xabi Wittler/Extender: Tilda Franco in Treatment: 3 Wound Status Wound Number: 4 Primary Etiology: Atypical Wound Location: Left, Circumferential Lower Leg Wound Status: Open Wounding Event: Gradually Appeared Comorbid History: Quadriplegia Date Acquired: 08/05/2021 Weeks Of Treatment: 3 Clustered Wound: No Photos Wound Measurements Length: (cm) 7.8 Width: (cm) 1 Depth: (cm) 0.1 Area: (cm) 6.126 Volume: (cm) 0.613 % Reduction in Area: 98.8% % Reduction in Volume: 98.8% Epithelialization: None Wound Description Classification: Full Thickness Without Exposed Support Structures Exudate Amount: Medium Exudate Type: Sanguinous Exudate Color: red Foul Odor After Cleansing: No Slough/Fibrino No Wound Bed Granulation Amount: Large (67-100%) Exposed Structure Granulation Quality: Red, Hyper-granulation, Friable Fascia Exposed:  No Fat Layer (Subcutaneous Tissue) Exposed: Yes Tendon Exposed: No Muscle Exposed: No Joint Exposed: No Bone Exposed: No Treatment Notes Wound #4 (Lower Leg) Wound Laterality: Left, Circumferential Cleanser Wound Cleanser  Discharge Instruction: Wash your hands with soap and water. Remove old dressing, discard into plastic bag and place into trash. Cleanse the wound with Wound Cleanser prior to applying a clean dressing using gauze sponges, not tissues or cotton balls. Do not scrub or use excessive force. Pat dry using gauze sponges, not tissue or cotton balls. WOODWARD, KLEM ([id number]) Peri-Wound Care Topical Triamcinolone Acetonide Cream, 0.1%, 15 (g) tube Discharge Instruction: Apply as directed by Milcah Dulany. Nystatin Cream, 30 (g) tube Discharge Instruction: in office spray at home Primary Dressing Secondary Dressing Kerlix 4.5 x 4.1 (in/yd) Discharge Instruction: Apply Kerlix 4.5 x 4.1 (in/yd) as instructed Secured With Medipore Tape - 42M Medipore H Soft Cloth Surgical Tape, 2x2 (in/yd) Compression Wrap Compression Stockings Add-Ons Electronic Signature(s) Signed: 11/20/2021 4:16:15 PM By: Betha Loa Signed: 11/21/2021 7:54:04 AM By: Elliot Gurney, BSN, RN, CWS, Kim RN, BSN Entered By: Betha Loa on 11/20/2021 15:06:19 [name] ([id number]) -------------------------------------------------------------------------------- Wound Assessment Details Patient Name: [name] Date of Service: 11/20/2021 2:30 PM Medical Record Number: [id number] Patient Account Number: [id number] Date of Birth/Sex: 09-23-1985 (35 y.o. M) Treating RN: Huel Coventry Primary Care Aleesha Ringstad: Unknown Foley Other Clinician: Betha Loa Referring Demoni Parmar: Unknown Foley Treating Haliey Romberg/Extender: Tilda Franco in Treatment: 3 Wound Status Wound Number: 5 Primary Etiology: Atypical Wound Location: Anterior Foot Wound Status: Open Wounding Event: Gradually Appeared Comorbid  History: Quadriplegia Date Acquired: 07/06/2021 Weeks Of Treatment: 3 Clustered Wound: No Photos Wound Measurements Length: (cm) 3 Width: (cm) 4.8 Depth: (cm) 0.1 Area: (cm) 11.31 Volume: (cm) 1.131 % Reduction in Area: 52% % Reduction in Volume: 52% Epithelialization: None Wound Description Classification: Full Thickness Without Exposed Support Structures Exudate Amount: Medium Exudate Type: Sanguinous Exudate Color: red Foul Odor After Cleansing: No Slough/Fibrino Yes Wound Bed Granulation Amount: Small (1-33%) Exposed Structure Granulation Quality: Red, Hyper-granulation, Friable Fascia Exposed: No Necrotic Amount: Large (67-100%) Fat Layer (Subcutaneous Tissue) Exposed: Yes Necrotic Quality: Adherent Slough Tendon Exposed: No Muscle Exposed: No Joint Exposed: No Bone Exposed: No Treatment Notes Wound #5 (Foot) Wound Laterality: Anterior Cleanser Wound Cleanser Discharge Instruction: Wash your hands with soap and water. Remove old dressing, discard into plastic bag and place into trash. Cleanse the wound with Wound Cleanser prior to applying a clean dressing using gauze sponges, not tissues or cotton balls. Do not scrub or use excessive force. Pat dry using gauze sponges, not tissue or cotton balls. RASEAN, JOOS ([id number]) Peri-Wound Care Topical Triamcinolone Acetonide Cream, 0.1%, 15 (g) tube Discharge Instruction: Apply as directed by Johniya Durfee. Nystatin Cream, 30 (g) tube Discharge Instruction: in office spray at home Primary Dressing Secondary Dressing Kerlix 4.5 x 4.1 (in/yd) Discharge Instruction: Apply Kerlix 4.5 x 4.1 (in/yd) as instructed Secured With Medipore Tape - 42M Medipore H Soft Cloth Surgical Tape, 2x2 (in/yd) Compression Wrap Compression Stockings Add-Ons Electronic Signature(s) Signed: 11/20/2021 4:16:15 PM By: Betha Loa Signed: 11/21/2021 7:54:04 AM By: Elliot Gurney, BSN, RN, CWS, Kim RN, BSN Entered By: Betha Loa on 11/20/2021  15:07:42 [name] ([id number]) -------------------------------------------------------------------------------- Wound Assessment Details Patient Name: [name] Date of Service: 11/20/2021 2:30 PM Medical Record Number: [id number] Patient Account Number: [id number] Date of Birth/Sex: Aug 24, 1985 (35 y.o. M) Treating RN: Huel Coventry Primary Care Kammie Scioli: Unknown Foley Other Clinician: Betha Loa Referring Madelaine Whipple: Unknown Foley Treating Quetzal Meany/Extender: Tilda Franco in Treatment: 3 Wound Status Wound Number: 6 Primary Etiology: Atypical Wound Location: Back Wound Status: Open Wounding Event: Gradually Appeared Comorbid History: Quadriplegia Date Acquired: 07/06/2021 Weeks Of Treatment: 3 Clustered Wound: No Photos Wound Measurements  Length: (cm) 18 Width: (cm) 28.5 Depth: (cm) 0.1 Area: (cm) 402.909 Volume: (cm) 40.291 % Reduction in Area: 48.2% % Reduction in Volume: 48.2% Epithelialization: None Wound Description Classification: Full Thickness Without Exposed Support Structures Exudate Amount: Medium Exudate Type: Sanguinous Exudate Color: red Foul Odor After Cleansing: No Slough/Fibrino No Wound Bed Granulation Amount: Large (67-100%) Exposed Structure Granulation Quality: Red, Hyper-granulation, Friable Fascia Exposed: No Necrotic Amount: None Present (0%) Fat Layer (Subcutaneous Tissue) Exposed: Yes Tendon Exposed: No Muscle Exposed: No Joint Exposed: No Bone Exposed: No Treatment Notes Wound #6 (Back) Cleanser Wound Cleanser Discharge Instruction: Wash your hands with soap and water. Remove old dressing, discard into plastic bag and place into trash. Cleanse the wound with Wound Cleanser prior to applying a clean dressing using gauze sponges, not tissues or cotton balls. Do not scrub or use excessive force. Pat dry using gauze sponges, not tissue or cotton balls. DAMICO, TRZCINSKI ([id number]) Peri-Wound Care Topical Triamcinolone  Acetonide Cream, 0.1%, 15 (g) tube Discharge Instruction: Apply as directed by Wrangler Penning. Nystatin Cream, 30 (g) tube Discharge Instruction: in office spray at home Primary Dressing Secondary Dressing Gauze Discharge Instruction: As directed: dry, moistened with saline or moistened with Dakins Solution Secured With Medipore Tape - 74M Medipore H Soft Cloth Surgical Tape, 2x2 (in/yd) Compression Wrap Compression Stockings Add-Ons Electronic Signature(s) Signed: 11/20/2021 4:16:15 PM By: Massie Kluver Signed: 11/21/2021 7:54:04 AM By: Gretta Cool, BSN, RN, CWS, Kim RN, BSN Entered By: Massie Kluver on 11/20/2021 15:08:29 [name] ([id number]) -------------------------------------------------------------------------------- [place] Details Patient Name: [name] Date of Service: 11/20/2021 2:30 PM Medical Record Number: [id number] Patient Account Number: [id number] Date of Birth/Sex: 09-Aug-1985 (35 y.o. M) Treating RN: Cornell Barman Primary Care Alessia Gonsalez: August Luz Other Clinician: Massie Kluver Referring Chaynce Schafer: August Luz Treating Danner Paulding/Extender: Yaakov Guthrie in Treatment: 3 Vital Signs Time Taken: 14:46 Temperature (F): 98.1 Height (in): 73 Pulse (bpm): 90 Weight (lbs): 185 Respiratory Rate (breaths/min): 16 Body Mass Index (BMI): 24.4 Blood Pressure (mmHg): 109/74 Reference Range: 80 - 120 mg / dl Electronic Signature(s) Signed: 11/20/2021 4:16:15 PM By: Massie Kluver Entered By: Massie Kluver on 11/20/2021 14:47:38

## 2021-11-22 NOTE — Progress Notes (Signed)
UPTON, RUSSEY ([id number]) Visit Report for 11/20/2021 Chief Complaint Document Details Patient Name: [name], [name] Date of Service: 11/20/2021 2:30 PM Medical Record Number: [id number] Patient Account Number: [id number] Date of Birth/Sex: 09-Aug-1985 (36 y.o. M) Treating RN: Huel Coventry Primary Care Provider: Unknown Foley Other Clinician: Betha Loa Referring Provider: Unknown Foley Treating Provider/Extender: Tilda Franco in Treatment: 3 Information Obtained from: Patient Chief Complaint 10/30/2021; left lower extremity wounds, back wounds Electronic Signature(s) Signed: 11/20/2021 3:23:18 PM By: Geralyn Corwin DO Entered By: Geralyn Corwin on 11/20/2021 15:16:41 [name] ([id number]) -------------------------------------------------------------------------------- HPI Details Patient Name: [name] Date of Service: 11/20/2021 2:30 PM Medical Record Number: [id number] Patient Account Number: [id number] Date of Birth/Sex: [birth year] (35 y.o. M) Treating RN: Huel Coventry Primary Care Provider: Unknown Foley Other Clinician: Betha Loa Referring Provider: Unknown Foley Treating Provider/Extender: Tilda Franco in Treatment: 3 History of Present Illness HPI Description: 03/18/2021 upon evaluation today patient presents for initial inspection here in the clinic concerning issues that he has been having with his back. Fortunately this seems to be related to multiple potential issues here which involve moisture, pressure, and I believe a fungal infection as well. In the past he seems to have responded to steroids topically. With that being said I am thinking that might still be something that could be helpful for him. I am going to see about setting this and to the pharmacy to get things started. I also think we may want to do a wound culture in order to evaluate for any possibilities there. 04/19/2021 upon evaluation today patient actually appears to  be doing excellent in regard to his back. He is actually making wonderful progress as far as this is concerned. I do not see any signs of infection at this time which is great news and overall I think that he is doing quite well. Admission 10/30/2021 [name] is a 36 year old male with a past medical history of paraplegia that presents with extensive wounds to his back and left leg. He was seen in our clinic earlier in the year for a similar presentation but limited to the back area. This was treated effectively with antifungal spray, triamcinolone cream and oral antibiotics. He again presents today with similar wounds to his back. Unfortunately he has also developed wounds to his left lower extremity. He states that the wounds started after he was casted for a broken leg. He has also developed wounds to the left upper thigh. He states he was sunburned to this area and subsequently developed wounds. He has currently been keeping the wounds covered. He denies systemic signs of infection. 8/2; patient presents for follow-up. Has been using triamcinolone cream with antifungal spray on the wound beds. He has been taking Augmentin without issues. He reports improvement in wound healing. 8/16; patient presents for follow-up. He continues to use triamcinolone cream with antifungal spray to the wound beds. He has completed his course of Augmentin. He continues to report improvement in wound healing. He has no issues or complaints today. Electronic Signature(s) Signed: 11/20/2021 3:23:18 PM By: Geralyn Corwin DO Entered By: Geralyn Corwin on 11/20/2021 15:17:08 [name] ([id number]) -------------------------------------------------------------------------------- Physical Exam Details Patient Name: [name] Date of Service: 11/20/2021 2:30 PM Medical Record Number: [id number] Patient Account Number: [id number] Date of Birth/Sex: 1986-02-10 (35 y.o. M) Treating RN: Huel Coventry Primary  Care Provider: Unknown Foley Other Clinician: Betha Loa Referring Provider: Unknown Foley Treating Provider/Extender: Tilda Franco in Treatment: 3 Constitutional . Cardiovascular . Psychiatric . Notes Back:  Multiple open wounds with granulation tissue throughout. Left lower extremity: Small scattered open wounds with granulation tissue present. No signs of infection. Electronic Signature(s) Signed: 11/20/2021 3:23:18 PM By: Geralyn Corwin DO Entered By: Geralyn Corwin on 11/20/2021 15:17:45 [name] ([id number]) -------------------------------------------------------------------------------- Physician Orders Details Patient Name: [name] Date of Service: 11/20/2021 2:30 PM Medical Record Number: [id number] Patient Account Number: [id number] Date of Birth/Sex: [birth year] (35 y.o. M) Treating RN: Huel Coventry Primary Care Provider: Unknown Foley Other Clinician: Betha Loa Referring Provider: Unknown Foley Treating Provider/Extender: Tilda Franco in Treatment: 3 Verbal / Phone Orders: No Diagnosis Coding Follow-up Appointments o Return Appointment in 1 week. Bathing/ Shower/ Hygiene o May shower; gently cleanse wound with antibacterial soap, rinse and pat dry prior to dressing wounds Wound Treatment Wound #3 - Upper Leg Wound Laterality: Left Cleanser: Wound Cleanser 1 x Per Day/30 Days Discharge Instructions: Wash your hands with soap and water. Remove old dressing, discard into plastic bag and place into trash. Cleanse the wound with Wound Cleanser prior to applying a clean dressing using gauze sponges, not tissues or cotton balls. Do not scrub or use excessive force. Pat dry using gauze sponges, not tissue or cotton balls. Topical: Triamcinolone Acetonide Cream, 0.1%, 15 (g) tube 1 x Per Day/30 Days Discharge Instructions: Apply as directed by provider. Topical: Nystatin Cream, 30 (g) tube 1 x Per Day/30 Days Discharge  Instructions: in office spray at home Secondary Dressing: ABD Pad 5x9 (in/in) 1 x Per Day/30 Days Discharge Instructions: Cover with ABD pad Secured With: Medipore Tape - 62M Medipore H Soft Cloth Surgical Tape, 2x2 (in/yd) 1 x Per Day/30 Days Wound #4 - Lower Leg Wound Laterality: Left, Circumferential Cleanser: Wound Cleanser 1 x Per Day/30 Days Discharge Instructions: Wash your hands with soap and water. Remove old dressing, discard into plastic bag and place into trash. Cleanse the wound with Wound Cleanser prior to applying a clean dressing using gauze sponges, not tissues or cotton balls. Do not scrub or use excessive force. Pat dry using gauze sponges, not tissue or cotton balls. Topical: Triamcinolone Acetonide Cream, 0.1%, 15 (g) tube 1 x Per Day/30 Days Discharge Instructions: Apply as directed by provider. Topical: Nystatin Cream, 30 (g) tube 1 x Per Day/30 Days Discharge Instructions: in office spray at home Secondary Dressing: Kerlix 4.5 x 4.1 (in/yd) 1 x Per Day/30 Days Discharge Instructions: Apply Kerlix 4.5 x 4.1 (in/yd) as instructed Secured With: Medipore Tape - 62M Medipore H Soft Cloth Surgical Tape, 2x2 (in/yd) 1 x Per Day/30 Days Wound #5 - Foot Wound Laterality: Anterior Cleanser: Wound Cleanser 1 x Per Day/30 Days Discharge Instructions: Wash your hands with soap and water. Remove old dressing, discard into plastic bag and place into trash. Cleanse the wound with Wound Cleanser prior to applying a clean dressing using gauze sponges, not tissues or cotton balls. Do not scrub or use excessive force. Pat dry using gauze sponges, not tissue or cotton balls. Topical: Triamcinolone Acetonide Cream, 0.1%, 15 (g) tube 1 x Per Day/30 Days Discharge Instructions: Apply as directed by provider. Topical: Nystatin Cream, 30 (g) tube 1 x Per Day/30 Days Discharge Instructions: in office spray at home Secondary Dressing: Kerlix 4.5 x 4.1 (in/yd) 1 x Per Day/30 Days ABDULWAHAB, DEMELO  ([id number]) Discharge Instructions: Apply Kerlix 4.5 x 4.1 (in/yd) as instructed Secured With: Medipore Tape - 62M Medipore H Soft Cloth Surgical Tape, 2x2 (in/yd) 1 x Per Day/30 Days Wound #6 - Back Cleanser: Wound Cleanser 1 x Per Day/30  Days Discharge Instructions: Wash your hands with soap and water. Remove old dressing, discard into plastic bag and place into trash. Cleanse the wound with Wound Cleanser prior to applying a clean dressing using gauze sponges, not tissues or cotton balls. Do not scrub or use excessive force. Pat dry using gauze sponges, not tissue or cotton balls. Topical: Triamcinolone Acetonide Cream, 0.1%, 15 (g) tube 1 x Per Day/30 Days Discharge Instructions: Apply as directed by provider. Topical: Nystatin Cream, 30 (g) tube 1 x Per Day/30 Days Discharge Instructions: in office spray at home Secondary Dressing: Gauze 1 x Per Day/30 Days Discharge Instructions: As directed: dry, moistened with saline or moistened with Dakins Solution Secured With: Medipore Tape - 91M Medipore H Soft Cloth Surgical Tape, 2x2 (in/yd) 1 x Per Day/30 Days Electronic Signature(s) Signed: 11/20/2021 3:23:18 PM By: Geralyn Corwin DO Entered By: Geralyn Corwin on 11/20/2021 15:22:04 [name] ([id number]) -------------------------------------------------------------------------------- Problem List Details Patient Name: [name] Date of Service: 11/20/2021 2:30 PM Medical Record Number: [id number] Patient Account Number: [id number] Date of Birth/Sex: [birth year] (35 y.o. 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 10/30/2021 No Yes L97.829 Non-pressure chronic ulcer of other part of  left lower leg with 10/30/2021 No Yes unspecified severity B35.4 Tinea corporis 10/30/2021 No Yes L98.8 Other specified disorders of the skin and subcutaneous tissue 10/30/2021 No Yes G82.22 Paraplegia, incomplete 10/30/2021 No Yes Inactive Problems Resolved Problems Electronic Signature(s) Signed: 11/20/2021 3:23:18 PM By: Geralyn Corwin DO Entered By: Geralyn Corwin on 11/20/2021 15:16:38 [name] ([id number]) -------------------------------------------------------------------------------- Progress Note Details Patient Name: [name] Date of Service: 11/20/2021 2:30 PM Medical Record Number: [id number] Patient Account Number: [id number] Date of Birth/Sex: 1985/06/14 (35 y.o. M) Treating RN: Huel Coventry Primary Care Provider: Unknown Foley Other Clinician: Betha Loa Referring Provider: Unknown Foley Treating Provider/Extender: Tilda Franco in Treatment: 3 Subjective Chief Complaint Information obtained from Patient 10/30/2021; left lower extremity wounds, back wounds History of Present Illness (HPI) 03/18/2021 upon evaluation today patient presents for initial inspection here in the clinic concerning issues that he has been having with his back. Fortunately this seems to be related to multiple potential issues here which involve moisture, pressure, and I believe a fungal infection as well. In the past he seems to have responded to steroids topically. With that being said I am thinking that might still be something that could be helpful for him. I am going to see about setting this and to the pharmacy to get things started. I also think we may want to do a wound culture in order to evaluate for any possibilities there. 04/19/2021 upon evaluation today patient actually appears to be doing excellent in regard to his back. He is actually making wonderful progress as far as this is concerned. I do not see any signs of infection at this time which is great news and  overall I think that he is doing quite well. Admission 10/30/2021 Mr. Marvis Saefong is a 36 year old male with a past medical history of paraplegia that presents with extensive wounds to his back and left leg. He was seen in our clinic earlier in the year for a similar presentation but limited to the back area. This was treated effectively with antifungal spray, triamcinolone cream and oral antibiotics. He again presents today  with similar wounds to his back. Unfortunately he has also developed wounds to his left lower extremity. He states that the wounds started after he was casted for a broken leg. He has also developed wounds to the left upper thigh. He states he was sunburned to this area and subsequently developed wounds. He has currently been keeping the wounds covered. He denies systemic signs of infection. 8/2; patient presents for follow-up. Has been using triamcinolone cream with antifungal spray on the wound beds. He has been taking Augmentin without issues. He reports improvement in wound healing. 8/16; patient presents for follow-up. He continues to use triamcinolone cream with antifungal spray to the wound beds. He has completed his course of Augmentin. He continues to report improvement in wound healing. He has no issues or complaints today. Patient History Information obtained from Patient. Social History Former smoker, Marital Status - Divorced, Alcohol Use - Never, Drug Use - Current History - CBD, Caffeine Use - Daily. Medical History Neurologic Patient has history of Quadriplegia Objective Constitutional Vitals Time Taken: 2:46 PM, Height: 73 in, Weight: 185 lbs, BMI: 24.4, Temperature: 98.1 F, Pulse: 90 bpm, Respiratory Rate: 16 breaths/min, Blood Pressure: 109/74 mmHg. General Notes: Back: Multiple open wounds with granulation tissue throughout. Left lower extremity: Small scattered open wounds with granulation tissue present. No signs of infection. Integumentary (Hair,  Skin) Wound #3 status is Open. Original cause of wound was Other Lesion. The date acquired was: 08/05/2021. The wound has been in treatment 3 weeks. The wound is located on the Left Upper Leg. The wound measures 0.1cm length x 0.1cm width x 0.1cm depth; 0.008cm^2 area and Espejo, Erling ([id number]) 0.001cm^3 volume. There is Fat Layer (Subcutaneous Tissue) exposed. There is no tunneling or undermining noted. There is a medium amount of sanguinous drainage noted. There is large (67-100%) friable, hyper - granulation within the wound bed. There is no necrotic tissue within the wound bed. Wound #4 status is Open. Original cause of wound was Gradually Appeared. The date acquired was: 08/05/2021. The wound has been in treatment 3 weeks. The wound is located on the Left,Circumferential Lower Leg. The wound measures 7.8cm length x 1cm width x 0.1cm depth; 6.126cm^2 area and 0.613cm^3 volume. There is Fat Layer (Subcutaneous Tissue) exposed. There is a medium amount of sanguinous drainage noted. There is large (67-100%) red, friable, hyper - granulation within the wound bed. Wound #5 status is Open. Original cause of wound was Gradually Appeared. The date acquired was: 07/06/2021. The wound has been in treatment 3 weeks. The wound is located on the Anterior Foot. The wound measures 3cm length x 4.8cm width x 0.1cm depth; 11.31cm^2 area and 1.131cm^3 volume. There is Fat Layer (Subcutaneous Tissue) exposed. There is a medium amount of sanguinous drainage noted. There is small (1-33%) red, friable, hyper - granulation within the wound bed. There is a large (67-100%) amount of necrotic tissue within the wound bed including Adherent Slough. Wound #6 status is Open. Original cause of wound was Gradually Appeared. The date acquired was: 07/06/2021. The wound has been in treatment 3 weeks. The wound is located on the Back. The wound measures 18cm length x 28.5cm width x 0.1cm depth; 402.909cm^2 area and 40.291cm^3 volume.  There is Fat Layer (Subcutaneous Tissue) exposed. There is a medium amount of sanguinous drainage noted. There is large (67-100%) red, friable, hyper - granulation within the wound bed. There is no necrotic tissue within the wound bed. 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 I recommended continuing this and following up in 4 weeks. I am hopeful he will be healed by then. He knows to call with any questions or concerns. Plan Follow-up Appointments: Return Appointment in 1 week. Bathing/ Shower/ Hygiene: May shower; gently cleanse wound with antibacterial soap, rinse and pat dry prior to dressing wounds WOUND #3: - Upper Leg Wound Laterality: Left Cleanser: Wound Cleanser 1 x Per Day/30 Days Discharge Instructions: Wash your hands with soap and water. Remove old dressing, discard into plastic bag and place into trash. Cleanse the wound with Wound Cleanser prior to applying a clean dressing using gauze sponges, not tissues or cotton balls. Do not scrub or use excessive force. Pat dry using gauze sponges, not tissue or cotton balls. Topical: Triamcinolone Acetonide Cream, 0.1%, 15 (g) tube 1 x Per Day/30 Days Discharge Instructions: Apply as directed by provider. Topical: Nystatin Cream, 30 (g) tube 1 x Per Day/30 Days Discharge Instructions: in office spray at home Secondary Dressing: ABD Pad 5x9 (in/in) 1 x Per Day/30 Days Discharge Instructions: Cover with ABD pad Secured With: Medipore Tape - 18M Medipore H Soft Cloth Surgical Tape, 2x2 (in/yd) 1 x Per Day/30 Days WOUND #4: - Lower Leg Wound Laterality: Left,  Circumferential Cleanser: Wound Cleanser 1 x Per Day/30 Days Discharge Instructions: Wash your hands with soap and water. Remove old dressing, discard into plastic bag and place into trash. Cleanse the wound with Wound Cleanser prior to applying a clean dressing using gauze sponges, not tissues or cotton balls. Do not scrub or use excessive force. Pat dry using gauze sponges, not tissue or cotton balls. Topical: Triamcinolone Acetonide Cream, 0.1%, 15 (g) tube 1 x Per Day/30 Days Discharge Instructions: Apply as directed by provider. Topical: Nystatin Cream, 30 (g) tube 1 x Per Day/30 Days Discharge Instructions: in office spray at home Secondary Dressing: Kerlix 4.5 x 4.1 (in/yd) 1 x Per Day/30 Days Discharge Instructions: Apply Kerlix 4.5 x 4.1 (in/yd) as instructed Secured With: Medipore Tape - 18M Medipore H Soft Cloth Surgical Tape, 2x2 (in/yd) 1 x Per Day/30 Days WOUND #5: - Foot Wound Laterality: Anterior Cleanser: Wound Cleanser 1 x Per Day/30 Days Discharge Instructions: Wash your hands with soap and water. Remove old dressing, discard into plastic bag and place into trash. HOPE, HOLST ([id number]) Cleanse the wound with Wound Cleanser prior to applying a clean dressing using gauze sponges, not tissues or cotton balls. Do not scrub or use excessive force. Pat dry using gauze sponges, not tissue or cotton balls. Topical: Triamcinolone Acetonide Cream, 0.1%, 15 (g) tube 1 x Per Day/30 Days Discharge Instructions: Apply as directed by provider. Topical: Nystatin Cream, 30 (g) tube 1 x Per Day/30 Days Discharge Instructions: in office spray at home Secondary Dressing: Kerlix 4.5 x 4.1 (in/yd) 1 x Per Day/30 Days Discharge Instructions: Apply Kerlix 4.5 x 4.1 (in/yd) as instructed Secured With: Medipore Tape - 18M Medipore H Soft Cloth Surgical Tape, 2x2 (in/yd) 1 x Per Day/30 Days WOUND #6: - Back Wound Laterality: Cleanser: Wound Cleanser 1 x Per Day/30 Days Discharge Instructions:  Wash your hands with soap and water. Remove old dressing, discard into plastic bag and place into trash. Cleanse the wound with Wound Cleanser prior  to applying a clean dressing using gauze sponges, not tissues or cotton balls. Do not scrub or use excessive force. Pat dry using gauze sponges, not tissue or cotton balls. Topical: Triamcinolone Acetonide Cream, 0.1%, 15 (g) tube 1 x Per Day/30 Days Discharge Instructions: Apply as directed by provider. Topical: Nystatin Cream, 30 (g) tube 1 x Per Day/30 Days Discharge Instructions: in office spray at home Secondary Dressing: Gauze 1 x Per Day/30 Days Discharge Instructions: As directed: dry, moistened with saline or moistened with Dakins Solution Secured With: Medipore Tape - 104M Medipore H Soft Cloth Surgical Tape, 2x2 (in/yd) 1 x Per Day/30 Days 1. TCA with antifungal spray 2. Follow-up in 4 weeks Electronic Signature(s) Signed: 11/20/2021 3:23:18 PM By: Geralyn CorwinHoffman, Neveah Bang DO Entered By: Geralyn CorwinHoffman, Lisa Blakeman on 11/20/2021 15:21:41 Robert SpringEESE, Elyas ([id number]) -------------------------------------------------------------------------------- ROS/PFSH Details Patient Name: Robert SpringEESE, Nazareth Date of Service: 11/20/2021 2:30 PM Medical Record Number: [id number] Patient Account Number: [id number] Date of Birth/Sex: 03-18-1986 (35 y.o. M) Treating RN: Huel CoventryWoody, Kim Primary Care Provider: Unknown FoleyWhitten, Robin Other Clinician: Betha LoaVenable, Angie Referring Provider: Unknown FoleyWhitten, Robin Treating Provider/Extender: Tilda FrancoHoffman, Pollyanna Levay Weeks in Treatment: 3 Information Obtained From Patient Neurologic Medical History: Positive for: Quadriplegia Immunizations Pneumococcal Vaccine: Received Pneumococcal Vaccination: No Implantable Devices None Family and Social History Former smoker; Marital Status - Divorced; Alcohol Use: Never; Drug Use: Current History - CBD; Caffeine Use: Daily; Financial Concerns: No; Food, Clothing or Shelter Needs: No; Support System Lacking: No;  Transportation Concerns: No Electronic Signature(s) Signed: 11/20/2021 3:23:18 PM By: Geralyn CorwinHoffman, Kohner Orlick DO Signed: 11/21/2021 7:54:04 AM By: Elliot GurneyWoody, BSN, RN, CWS, Kim RN, BSN Entered By: Geralyn CorwinHoffman, Rembert Browe on 11/20/2021 15:20:14 Robert SpringEESE, Rigo ([id number]) -------------------------------------------------------------------------------- SuperBill Details Patient Name: Robert SpringEESE, Hatem Date of Service: 11/20/2021 Medical Record Number: [id number] Patient Account Number: [id number] Date of Birth/Sex: 03-18-1986 (35 y.o. 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 L97.829 Non-pressure chronic ulcer of other part of left lower leg with unspecified severity B35.4 Tinea corporis L98.8 Other specified disorders of the skin and subcutaneous tissue G82.22 Paraplegia, incomplete Facility Procedures CPT4 Code: [id number] Description: 99214 - WOUND CARE VISIT-LEV 4 EST PT Modifier: Quantity: 1 Physician Procedures CPT4 Code: [id number] Description: 99213 - WC PHYS LEVEL 3 - EST PT Modifier: Quantity: 1 CPT4 Code: Description: ICD-10 Diagnosis Description L98.422 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

## 2021-12-18 ENCOUNTER — Encounter: Payer: Medicare Other | Attending: Internal Medicine | Admitting: Internal Medicine

## 2021-12-18 DIAGNOSIS — B354 Tinea corporis: Secondary | ICD-10-CM | POA: Insufficient documentation

## 2021-12-18 DIAGNOSIS — G8250 Quadriplegia, unspecified: Secondary | ICD-10-CM | POA: Diagnosis not present

## 2021-12-18 DIAGNOSIS — L988 Other specified disorders of the skin and subcutaneous tissue: Secondary | ICD-10-CM | POA: Diagnosis not present

## 2021-12-18 DIAGNOSIS — L98422 Non-pressure chronic ulcer of back with fat layer exposed: Secondary | ICD-10-CM | POA: Insufficient documentation

## 2021-12-18 DIAGNOSIS — L97829 Non-pressure chronic ulcer of other part of left lower leg with unspecified severity: Secondary | ICD-10-CM

## 2021-12-18 DIAGNOSIS — S81811A Laceration without foreign body, right lower leg, initial encounter: Secondary | ICD-10-CM | POA: Insufficient documentation

## 2021-12-19 NOTE — Progress Notes (Signed)
[name] ([id number]) Visit Report for 12/18/2021 Arrival Information Details Patient Name: [name], [name] Date of Service: 12/18/2021 2:15 PM Medical Record Number: [id number] Patient Account Number: [id number] Date of Birth/Sex: May 05, 1985 (36 y.o. M) Treating RN: [name] Primary Care [name]: [name] Other Clinician: Betha [name] Referring [name]: [name] Treating [name]/Extender: [name] in Treatment: 7 Visit Information History Since Last Visit All ordered tests and consults were completed: No Patient Arrived: Wheel Chair Added or deleted any medications: No Arrival Time: 14:25 Any new allergies or adverse reactions: No Transfer Assistance: None Had a fall or experienced change in No Patient Requires Transmission-Based Precautions: No activities of daily living that may affect Patient Has Alerts: No risk of falls: Hospitalized since last visit: No Pain Present Now: No Electronic Signature(s) Signed: 12/18/2021 5:04:43 PM By: [name] Entered By: [name] on 12/18/2021 14:26:07 [name] ([id number]) -------------------------------------------------------------------------------- Clinic Level of Care Assessment Details Patient Name: [name] Date of Service: 12/18/2021 2:15 PM Medical Record Number: [id number] Patient Account Number: [id number] Date of Birth/Sex: 1985-05-19 (35 y.o. M) Treating RN: [name] Primary Care Zerek Litsey: [name] Other Clinician: Betha [name] Referring [name]: [name] Treating [name]/Extender: [name] in Treatment: 7 Clinic Level of Care Assessment Items TOOL 4 Quantity Score []  - Use when only an EandM is performed on FOLLOW-UP visit 0 ASSESSMENTS - Nursing Assessment / Reassessment X - Reassessment of Co-morbidities (includes updates in patient status) 1 10 X- 1 5 Reassessment of Adherence to Treatment Plan ASSESSMENTS - Wound and Skin  Assessment / Reassessment []  - Simple Wound Assessment / Reassessment - one wound 0 X- 5 5 Complex Wound Assessment / Reassessment - multiple wounds []  - 0 Dermatologic / Skin Assessment (not related to wound area) ASSESSMENTS - Focused Assessment []  - Circumferential Edema Measurements - multi extremities 0 []  - 0 Nutritional Assessment / Counseling / Intervention []  - 0 Lower Extremity Assessment (monofilament, tuning fork, pulses) []  - 0 Peripheral Arterial Disease Assessment (using hand held doppler) ASSESSMENTS - Ostomy and/or Continence Assessment and Care []  - Incontinence Assessment and Management 0 []  - 0 Ostomy Care Assessment and Management (repouching, etc.) PROCESS - Coordination of Care X - Simple Patient / Family Education for ongoing care 1 15 []  - 0 Complex (extensive) Patient / Family Education for ongoing care []  - 0 Staff obtains , Records, Test Results / Process Orders []  - 0 Staff telephones HHA, Nursing Homes / Clarify orders / etc []  - 0 Routine Transfer to another Facility (non-emergent condition) []  - 0 Routine Hospital Admission (non-emergent condition) []  - 0 New Admissions / / Ordering NPWT, Apligraf, etc. []  - 0 Emergency Hospital Admission (emergent condition) X- 1 10 Simple Discharge Coordination []  - 0 Complex (extensive) Discharge Coordination PROCESS - Special Needs []  - Pediatric / Minor Patient Management 0 []  - 0 Isolation Patient Management []  - 0 Hearing / Language / Visual special needs []  - 0 Assessment of Community assistance (transportation, D/C planning, etc.) []  - 0 Additional assistance / Altered mentation []  - 0 Support Surface(s) Assessment (bed, cushion, seat, etc.) INTERVENTIONS - Wound Cleansing / Measurement [name] ( ) []  - 0 Simple Wound Cleansing - one wound X- 5 5 Complex Wound Cleansing - multiple wounds X- 1 5 Wound Imaging (photographs - any number of  wounds) []  - 0 Wound Tracing (instead of photographs) X- 1 5 Simple Wound Measurement - one wound []  - 0 Complex Wound Measurement - multiple wounds INTERVENTIONS -  Wound Dressings X - Small Wound Dressing one or multiple wounds 4 10 []  - 0 Medium Wound Dressing one or multiple wounds []  - 0 Large Wound Dressing one or multiple wounds X- 1 5 Application of Medications - topical []  - 0 Application of Medications - injection INTERVENTIONS - Miscellaneous []  - External ear exam 0 []  - 0 Specimen Collection (cultures, biopsies, blood, body fluids, etc.) []  - 0 Specimen(s) / Culture(s) sent or taken to Lab for analysis []  - 0 Patient Transfer (multiple staff / / Similar devices) []  - 0 Simple Staple / Suture removal (25 or less) []  - 0 Complex Staple / Suture removal (26 or more) []  - 0 Hypo / Hyperglycemic Management (close monitor of Blood Glucose) []  - 0 Ankle / Brachial Index (ABI) - do not check if billed separately X- 1 5 Vital Signs Has the patient been seen at the hospital within the last three years: Yes Total Score: 150 Level Of Care: New/Established - Level 4 Electronic Signature(s) Signed: 12/18/2021 5:04:43 PM By: Entered By: on 12/18/2021 14:55:29 ( ) -------------------------------------------------------------------------------- Encounter Discharge Information Details Patient Name: [name] Date of Service: 12/18/2021 2:15 PM Medical Record Number: Patient Account Number: Date of Birth/Sex: 11/25/1985 (35 y.o. M) Treating RN: [name] Primary Care Amanda Steuart: [name] Other Clinician: 12/20/2021 Referring Neeta Storey: [name] Treating Hannah Crill/Extender: [id number] in Treatment: 7 Encounter Discharge Information Items Discharge Condition: Stable Ambulatory Status: Wheelchair Discharge Destination: Home Transportation: Private  Auto Accompanied By: sister Schedule Follow-up Appointment: No Clinical Summary of Care: Electronic Signature(s) Signed: 12/18/2021 5:04:43 PM By: 12/20/2021 Entered By: [id number] on 12/18/2021 15:48:09 02/[birth year]/1987 (04-10-1973) -------------------------------------------------------------------------------- Lower Extremity Assessment Details Patient Name: [name] Date of Service: 12/18/2021 2:15 PM Medical Record Number: [name] Patient Account Number: [name] Date of Birth/Sex: Jul 01, 1985 (35 y.o. M) Treating RN: [name] Primary Care Jancarlo Biermann: [name] Other Clinician: 12/20/2021 Referring Valera Vallas: [name] Treating Bricen Victory/Extender: [id number] in Treatment: 7 Electronic Signature(s) Signed: 12/18/2021 5:04:43 PM By: 12/20/2021 Signed: 12/19/2021 11:19:49 AM By: [id number], BSN, RN, CWS, Kim RN, BSN Entered By: 02/24/1987 on 12/18/2021 14:45:21 [name] ([name]) -------------------------------------------------------------------------------- Multi Wound Chart Details Patient Name: [name] Date of Service: 12/18/2021 2:15 PM Medical Record Number: [name] Patient Account Number: 12/20/2021 Date of Birth/Sex: [birth year] (35 y.o. M) Treating RN: Elliot Gurney Primary Care Yanis Larin: [name] Other Clinician: 12/20/2021 Referring Horace Lukas: [name] Treating Epic Tribbett/Extender: [id number] in Treatment: 7 Vital Signs Height(in): 73 Pulse(bpm): 72 Weight(lbs): 185 Blood Pressure(mmHg): 106/74 Body Mass Index(BMI): 24.4 Temperature(F): 98.2 Respiratory Rate(breaths/min): 16 Photos: Wound Location: Left Upper Leg Left, Circumferential Lower Leg Anterior Foot Wounding Event: Other Lesion Gradually Appeared Gradually Appeared Primary Etiology: Atypical Atypical Atypical Comorbid History: Quadriplegia Quadriplegia Quadriplegia Date Acquired: 08/05/2021 08/05/2021 07/06/2021 Weeks of  Treatment: 7 7 7  Wound Status: Open Open Open Wound Recurrence: No No No Measurements L x W x D (cm) 0.1x0.1x0.1 0.1x0.1x0.1 0.1x0.1x0.1 Area (cm) : 0.008 0.008 0.008 Volume (cm) : 0.001 0.001 0.001 % Reduction in Area: 100.00% 100.00% 100.00% % Reduction in Volume: 100.00% 100.00% 100.00% Classification: Full Thickness Without Exposed Full Thickness Without Exposed Full Thickness Without Exposed Support Structures Support Structures Support Structures Exudate Amount: Medium Medium Medium Exudate Type: Sanguinous Sanguinous Sanguinous Exudate Color: red red red Wound Margin: N/A N/A N/A Granulation Amount: Large (67-100%) Large (67-100%) Small (1-33%) Granulation Quality: Hyper-granulation, Friable Red, Hyper-granulation, Friable Red, Hyper-granulation, Friable  Necrotic Amount: None Present (0%) N/A Large (67-100%) Exposed Structures: Fat Layer (Subcutaneous Tissue): Fat Layer (Subcutaneous Tissue): Fat Layer (Subcutaneous Tissue): Yes Yes Yes Fascia: No Fascia: No Fascia: No Tendon: No Tendon: No Tendon: No Muscle: No Muscle: No Muscle: No Joint: No Joint: No Joint: No Bone: No Bone: No Bone: No Epithelialization: None None None Wound Number: 6 7 N/A Angello, Grey ([id number]) Photos: N/A Wound Location: Back Right, Anterior Lower Leg N/A Wounding Event: Gradually Appeared Skin Tear/Laceration N/A Primary Etiology: Atypical Skin Tear N/A Comorbid History: Quadriplegia Quadriplegia N/A Date Acquired: 07/06/2021 12/18/2021 N/A Weeks of Treatment: 7 0 N/A Wound Status: Open Open N/A Wound Recurrence: No No N/A Measurements L x W x D (cm) 30x21.4x0.1 0.5x0.7x0.1 N/A Area (cm) : 504.226 0.275 N/A Volume (cm) : 50.423 0.027 N/A % Reduction in Area: 35.20% N/A N/A % Reduction in Volume: 35.20% N/A N/A Classification: Full Thickness Without Exposed Full Thickness Without Exposed N/A Support Structures Support Structures Exudate Amount: Medium Medium N/A Exudate Type:  Sanguinous Serosanguineous N/A Exudate Color: red red, brown N/A Wound Margin: N/A Flat and Intact N/A Granulation Amount: Large (67-100%) Small (1-33%) N/A Granulation Quality: Red, Hyper-granulation, Friable Red N/A Necrotic Amount: None Present (0%) None Present (0%) N/A Exposed Structures: Fat Layer (Subcutaneous Tissue): Fat Layer (Subcutaneous Tissue): N/A Yes Yes Fascia: No Fascia: No Tendon: No Tendon: No Muscle: No Muscle: No Joint: No Joint: No Bone: No Bone: No Epithelialization: None None N/A Treatment Notes Electronic Signature(s) Signed: 12/18/2021 5:04:43 PM By: Robert LoaVenable, Angie Entered By: Robert LoaVenable, Angie on 12/18/2021 14:45:34 Robert SpringEESE, [name] ([id number]) -------------------------------------------------------------------------------- Multi-Disciplinary Care Plan Details Patient Name: Robert SpringEESE, [name] Date of Service: 12/18/2021 2:15 PM Medical Record Number: [id number] Patient Account Number: [id number] Date of Birth/Sex: Oct 05, 1985 (35 y.o. M) Treating RN: Robert CoventryWoody, Kim Primary Care Reis Goga: Robert FoleyWhitten, Robin Other Clinician: Betha LoaVenable, Angie Referring Dashun Borre: Robert FoleyWhitten, Robin Treating Zariya Minner/Extender: Robert FrancoHoffman, Jessica Weeks in Treatment: 7 Active Inactive Wound/Skin Impairment Nursing Diagnoses: Knowledge deficit related to ulceration/compromised skin integrity Goals: Patient/caregiver will verbalize understanding of skin care regimen Date Initiated: 10/30/2021 Target Resolution Date: 11/30/2021 Goal Status: Active Ulcer/skin breakdown will have a volume reduction of 30% by week 4 Date Initiated: 10/30/2021 Target Resolution Date: 11/30/2021 Goal Status: Active Ulcer/skin breakdown will have a volume reduction of 50% by week 8 Date Initiated: 10/30/2021 Target Resolution Date: 12/31/2021 Goal Status: Active Ulcer/skin breakdown will have a volume reduction of 80% by week 12 Date Initiated: 10/30/2021 Target Resolution Date: 01/14/2022 Goal Status:  Active Ulcer/skin breakdown will heal within 14 weeks Date Initiated: 10/30/2021 Target Resolution Date: 03/02/2022 Goal Status: Active Interventions: Assess patient/caregiver ability to obtain necessary supplies Assess patient/caregiver ability to perform ulcer/skin care regimen upon admission and as needed Assess ulceration(s) every visit Notes: Electronic Signature(s) Signed: 12/18/2021 5:04:43 PM By: Robert LoaVenable, Angie Signed: 12/19/2021 11:19:49 AM By: Elliot GurneyWoody, BSN, RN, CWS, Kim RN, BSN Entered By: Robert LoaVenable, Angie on 12/18/2021 14:45:24 Robert SpringEESE, [name] ([id number]) -------------------------------------------------------------------------------- Pain Assessment Details Patient Name: Robert SpringEESE, [name] Date of Service: 12/18/2021 2:15 PM Medical Record Number: [id number] Patient Account Number: [id number] Date of Birth/Sex: Oct 05, 1985 (35 y.o. M) Treating RN: Robert CoventryWoody, Kim Primary Care Manoj Enriquez: Robert FoleyWhitten, Robin Other Clinician: Betha LoaVenable, Angie Referring Willia Genrich: Robert FoleyWhitten, Robin Treating Kru Allman/Extender: Robert FrancoHoffman, Jessica Weeks in Treatment: 7 Active Problems Location of Pain Severity and Description of Pain Patient Has Paino Yes Site Locations Pain Location: Generalized Pain Duration of the Pain. Constant / Intermittento Constant Rate the pain. Current Pain Level: 4 Character of Pain Describe the Pain: Aching Pain Management and Medication Current  Pain Management: Medication: Yes Rest: Yes Electronic Signature(s) Signed: 12/18/2021 5:04:43 PM By: [name] Signed: 12/19/2021 11:19:49 AM By: Elliot Gurney, BSN, RN, CWS, Kim RN, BSN Entered By: [name] on 12/18/2021 14:28:40 [name] ([id number]) -------------------------------------------------------------------------------- Patient/Caregiver Education Details Patient Name: [name] Date of Service: 12/18/2021 2:15 PM Medical Record Number: [id number] Patient Account Number: [id number] Date of Birth/Gender: 12/02/1985 (35 y.o.  M) Treating RN: [name] Primary Care Physician: [name] Other Clinician: Betha [name] Referring Physician: Unknown [name] Treating Physician/Extender: [name] in Treatment: 7 Education Assessment Education Provided To: Patient Education Topics Provided Wound/Skin Impairment: Handouts: Other: continue wound care as directed Methods: Explain/Verbal Responses: State content correctly Electronic Signature(s) Signed: 12/18/2021 5:04:43 PM By: [name] Entered By: [name] on 12/18/2021 15:07:04 [name] ([id number]) -------------------------------------------------------------------------------- Wound Assessment Details Patient Name: [name] Date of Service: 12/18/2021 2:15 PM Medical Record Number: [id number] Patient Account Number: [id number] Date of Birth/Sex: [birth year] (35 y.o. M) Treating RN: [name] Primary Care Tinsley Everman: [name] Other Clinician: Betha [name] Referring Kali Deadwyler: [name] Treating Brennley Curtice/Extender: [name] in Treatment: 7 Wound Status Wound Number: 3 Primary Etiology: Atypical Wound Location: Left Upper Leg Wound Status: Open Wounding Event: Other Lesion Comorbid History: Quadriplegia Date Acquired: 08/05/2021 Weeks Of Treatment: 7 Clustered Wound: No Photos Wound Measurements Length: (cm) 0.1 Width: (cm) 0.1 Depth: (cm) 0.1 Area: (cm) 0.008 Volume: (cm) 0.001 % Reduction in Area: 100% % Reduction in Volume: 100% Epithelialization: None Wound Description Classification: Full Thickness Without Exposed Support Structures Exudate Amount: Medium Exudate Type: Sanguinous Exudate Color: red Foul Odor After Cleansing: No Slough/Fibrino No Wound Bed Granulation Amount: Large (67-100%) Exposed Structure Granulation Quality: Hyper-granulation, Friable Fascia Exposed: No Necrotic Amount: None Present (0%) Fat Layer (Subcutaneous Tissue) Exposed: Yes Tendon Exposed:  No Muscle Exposed: No Joint Exposed: No Bone Exposed: No Treatment Notes Wound #3 (Upper Leg) Wound Laterality: Left Cleanser Wound Cleanser Discharge Instruction: Wash your hands with soap and water. Remove old dressing, discard into plastic bag and place into trash. Cleanse the wound with Wound Cleanser prior to applying a clean dressing using gauze sponges, not tissues or cotton balls. Do not scrub or use excessive force. Pat dry using gauze sponges, not tissue or cotton balls. JEFREY, RABURN ([id number]) Peri-Wound Care Topical Triamcinolone Acetonide Cream, 0.1%, 15 (g) tube Discharge Instruction: Apply as directed by Arkie Tagliaferro. Nystatin Cream, 30 (g) tube Discharge Instruction: in office spray at home Primary Dressing Secondary Dressing ABD Pad 5x9 (in/in) Discharge Instruction: Cover with ABD pad Secured With Medipore Tape - 56M Medipore H Soft Cloth Surgical Tape, 2x2 (in/yd) Compression Wrap Compression Stockings Add-Ons Electronic Signature(s) Signed: 12/18/2021 5:04:43 PM By: [name] Signed: 12/19/2021 11:19:49 AM By: Elliot Gurney, BSN, RN, CWS, Kim RN, BSN Entered By: [name] on 12/18/2021 14:36:19 [name] ([id number]) -------------------------------------------------------------------------------- Wound Assessment Details Patient Name: [name] Date of Service: 12/18/2021 2:15 PM Medical Record Number: [id number] Patient Account Number: [id number] Date of Birth/Sex: Aug 24, 1985 (35 y.o. M) Treating RN: [name] Primary Care Kaida Games: [name] Other Clinician: Betha [name] Referring Bretta Fees: [name] Treating Brendaly Townsel/Extender: [name] in Treatment: 7 Wound Status Wound Number: 4 Primary Etiology: Atypical Wound Location: Left, Circumferential Lower Leg Wound Status: Open Wounding Event: Gradually Appeared Comorbid History: Quadriplegia Date Acquired: 08/05/2021 Weeks Of Treatment: 7 Clustered Wound:  No Photos Wound Measurements Length: (cm) 0.1 Width: (cm) 0.1 Depth: (cm) 0.1 Area: (cm) 0.008 Volume: (cm) 0.001 % Reduction in Area: 100% % Reduction in Volume: 100% Epithelialization: None  Wound Description Classification: Full Thickness Without Exposed Support Structures Exudate Amount: Medium Exudate Type: Sanguinous Exudate Color: red Foul Odor After Cleansing: No Slough/Fibrino No Wound Bed Granulation Amount: Large (67-100%) Exposed Structure Granulation Quality: Red, Hyper-granulation, Friable Fascia Exposed: No Fat Layer (Subcutaneous Tissue) Exposed: Yes Tendon Exposed: No Muscle Exposed: No Joint Exposed: No Bone Exposed: No Treatment Notes Wound #4 (Lower Leg) Wound Laterality: Left, Circumferential Cleanser Wound Cleanser Discharge Instruction: Wash your hands with soap and water. Remove old dressing, discard into plastic bag and place into trash. Cleanse the wound with Wound Cleanser prior to applying a clean dressing using gauze sponges, not tissues or cotton balls. Do not scrub or use excessive force. Pat dry using gauze sponges, not tissue or cotton balls. REIGN, BARTNICK ([id number]) Peri-Wound Care Topical Triamcinolone Acetonide Cream, 0.1%, 15 (g) tube Discharge Instruction: Apply as directed by Ha Placeres. Nystatin Cream, 30 (g) tube Discharge Instruction: in office spray at home Primary Dressing Secondary Dressing Kerlix 4.5 x 4.1 (in/yd) Discharge Instruction: Apply Kerlix 4.5 x 4.1 (in/yd) as instructed Secured With Medipore Tape - 21M Medipore H Soft Cloth Surgical Tape, 2x2 (in/yd) Compression Wrap Compression Stockings Add-Ons Electronic Signature(s) Signed: 12/18/2021 5:04:43 PM By: [name] Signed: 12/19/2021 11:19:49 AM By: Elliot Gurney, BSN, RN, CWS, Kim RN, BSN Entered By: [name] on 12/18/2021 14:37:06 [name] ([id number]) -------------------------------------------------------------------------------- Wound Assessment  Details Patient Name: [name] Date of Service: 12/18/2021 2:15 PM Medical Record Number: [id number] Patient Account Number: [id number] Date of Birth/Sex: 12-14-1985 (35 y.o. M) Treating RN: [name] Primary Care Taytem Ghattas: [name] Other Clinician: Betha [name] Referring Otilia Kareem: [name] Treating Jermall Isaacson/Extender: [name] in Treatment: 7 Wound Status Wound Number: 5 Primary Etiology: Atypical Wound Location: Anterior Foot Wound Status: Healed - Epithelialized Wounding Event: Gradually Appeared Comorbid History: Quadriplegia Date Acquired: 07/06/2021 Weeks Of Treatment: 7 Clustered Wound: No Photos Wound Measurements Length: (cm) 0 Width: (cm) 0 Depth: (cm) 0 Area: (cm) Volume: (cm) % Reduction in Area: 100% % Reduction in Volume: 100% Epithelialization: None 0 0 Wound Description Classification: Full Thickness Without Exposed Support Structu Exudate Amount: Medium Exudate Type: Sanguinous Exudate Color: red res Foul Odor After Cleansing: No Slough/Fibrino Yes Wound Bed Granulation Amount: Small (1-33%) Exposed Structure Granulation Quality: Red, Hyper-granulation, Friable Fascia Exposed: No Necrotic Amount: Large (67-100%) Fat Layer (Subcutaneous Tissue) Exposed: Yes Tendon Exposed: No Muscle Exposed: No Joint Exposed: No Bone Exposed: No Treatment Notes Wound #5 (Foot) Wound Laterality: Anterior Cleanser Peri-Wound Care Topical Primary Dressing PLUMER, MITTELSTAEDT ([id number]) Secondary Dressing Secured With Compression Wrap Compression Stockings Add-Ons Electronic Signature(s) Signed: 12/18/2021 5:04:43 PM By: [name] Signed: 12/19/2021 11:19:49 AM By: Elliot Gurney, BSN, RN, CWS, Kim RN, BSN Entered By: [name] on 12/18/2021 14:53:52 [name] ([id number]) -------------------------------------------------------------------------------- Wound Assessment Details Patient Name: [name] Date of Service:  12/18/2021 2:15 PM Medical Record Number: [id number] Patient Account Number: [id number] Date of Birth/Sex: November 14, 1985 (35 y.o. M) Treating RN: [name] Primary Care Jaren Vanetten: [name] Other Clinician: Betha [name] Referring Makynzi Eastland: [name] Treating Kenika Sahm/Extender: [name] in Treatment: 7 Wound Status Wound Number: 6 Primary Etiology: Atypical Wound Location: Back Wound Status: Open Wounding Event: Gradually Appeared Comorbid History: Quadriplegia Date Acquired: 07/06/2021 Weeks Of Treatment: 7 Clustered Wound: No Photos Wound Measurements Length: (cm) 30 Width: (cm) 21.4 Depth: (cm) 0.1 Area: (cm) 504.226 Volume: (cm) 50.423 % Reduction in Area: 35.2% % Reduction in Volume: 35.2% Epithelialization: None Wound Description Classification: Full Thickness Without Exposed Support Structures Exudate Amount: Medium Exudate Type:  Sanguinous Exudate Color: red Foul Odor After Cleansing: No Slough/Fibrino No Wound Bed Granulation Amount: Large (67-100%) Exposed Structure Granulation Quality: Red, Hyper-granulation, Friable Fascia Exposed: No Necrotic Amount: None Present (0%) Fat Layer (Subcutaneous Tissue) Exposed: Yes Tendon Exposed: No Muscle Exposed: No Joint Exposed: No Bone Exposed: No Treatment Notes Wound #6 (Back) Cleanser Wound Cleanser Discharge Instruction: Wash your hands with soap and water. Remove old dressing, discard into plastic bag and place into trash. Cleanse the wound with Wound Cleanser prior to applying a clean dressing using gauze sponges, not tissues or cotton balls. Do not scrub or use excessive force. Pat dry using gauze sponges, not tissue or cotton balls. KHRIS, JANSSON ([id number]) Peri-Wound Care Topical Triamcinolone Acetonide Cream, 0.1%, 15 (g) tube Discharge Instruction: Apply as directed by Tanga Gloor. Nystatin Cream, 30 (g) tube Discharge Instruction: in office spray at home Primary  Dressing Secondary Dressing Gauze Discharge Instruction: As directed: dry, moistened with saline or moistened with Dakins Solution Secured With Medipore Tape - 7M Medipore H Soft Cloth Surgical Tape, 2x2 (in/yd) Compression Wrap Compression Stockings Add-Ons Electronic Signature(s) Signed: 12/18/2021 5:04:43 PM By: [name] Signed: 12/19/2021 11:19:49 AM By: Elliot Gurney, BSN, RN, CWS, Kim RN, BSN Entered By: [name] on 12/18/2021 14:45:13 [name] ([id number]) -------------------------------------------------------------------------------- Wound Assessment Details Patient Name: [name] Date of Service: 12/18/2021 2:15 PM Medical Record Number: [id number] Patient Account Number: [id number] Date of Birth/Sex: 1985-11-10 (35 y.o. M) Treating RN: [name] Primary Care Ashliegh Parekh: [name] Other Clinician: Betha [name] Referring Nickolus Wadding: [name] Treating Zigmond Trela/Extender: [name] in Treatment: 7 Wound Status Wound Number: 7 Primary Etiology: Skin Tear Wound Location: Right, Anterior Lower Leg Wound Status: Open Wounding Event: Skin Tear/Laceration Comorbid History: Quadriplegia Date Acquired: 12/18/2021 Weeks Of Treatment: 0 Clustered Wound: No Photos Wound Measurements Length: (cm) 0.5 Width: (cm) 0.7 Depth: (cm) 0.1 Area: (cm) 0.275 Volume: (cm) 0.027 % Reduction in Area: % Reduction in Volume: Epithelialization: None Tunneling: No Undermining: No Wound Description Classification: Full Thickness Without Exposed Support Structures Wound Margin: Flat and Intact Exudate Amount: Medium Exudate Type: Serosanguineous Exudate Color: red, brown Foul Odor After Cleansing: No Slough/Fibrino No Wound Bed Granulation Amount: Small (1-33%) Exposed Structure Granulation Quality: Red Fascia Exposed: No Necrotic Amount: None Present (0%) Fat Layer (Subcutaneous Tissue) Exposed: Yes Tendon Exposed: No Muscle Exposed: No Joint  Exposed: No Bone Exposed: No Treatment Notes Wound #7 (Lower Leg) Wound Laterality: Right, Anterior Cleanser Wound Cleanser Discharge Instruction: Wash your hands with soap and water. Remove old dressing, discard into plastic bag and place into trash. Cleanse the wound with Wound Cleanser prior to applying a clean dressing using gauze sponges, not tissues or cotton balls. Do not scrub or use excessive force. Pat dry using gauze sponges, not tissue or cotton balls. SEBASTHIAN, STAILEY ([id number]) Peri-Wound Care Topical Triamcinolone Acetonide Cream, 0.1%, 15 (g) tube Discharge Instruction: Apply as directed by Yolani Vo. Nystatin Cream, 30 (g) tube Discharge Instruction: in office spray at home Primary Dressing Secondary Dressing Gauze Discharge Instruction: As directed: dry, moistened with saline or moistened with Dakins Solution Secured With Medipore Tape - 7M Medipore H Soft Cloth Surgical Tape, 2x2 (in/yd) Compression Wrap Compression Stockings Add-Ons Electronic Signature(s) Signed: 12/18/2021 5:04:43 PM By: [name] Signed: 12/19/2021 11:19:49 AM By: Elliot Gurney, BSN, RN, CWS, Kim RN, BSN Entered By: [name] on 12/18/2021 14:44:31 [name] ([id number]) -------------------------------------------------------------------------------- Vitals Details Patient Name: [name] Date of Service: 12/18/2021 2:15 PM Medical Record Number: [id number] Patient Account Number: [id number] Date of Birth/Sex:  20-Jan-1986 (35 y.o. M) Treating RN: [name] Primary Care Bryanne Riquelme: [name] Other Clinician: Betha [name] Referring Elga Santy: [name] Treating Lindsea Olivar/Extender: [name] in Treatment: 7 Vital Signs Time Taken: 14:26 Temperature (F): 98.2 Height (in): 73 Pulse (bpm): 72 Weight (lbs): 185 Respiratory Rate (breaths/min): 16 Body Mass Index (BMI): 24.4 Blood Pressure (mmHg): 106/74 Reference Range: 80 - 120 mg / dl Electronic  Signature(s) Signed: 12/18/2021 5:04:43 PM By: [name] Entered By: [name] on 12/18/2021 14:28:31

## 2021-12-19 NOTE — Progress Notes (Signed)
[name], [name] ([id number]) Visit Report for 12/18/2021 Chief Complaint Document Details Patient Name: [name] Date of Service: 12/18/2021 2:15 PM Medical Record Number: [id number] Patient Account Number: [id number] Date of Birth/Sex: [birth year] (36 y.o. M) Treating RN: [name] Primary Care Provider: Unknown [name] Other Clinician: Betha [name] Referring Provider: Unknown [name] Treating Provider/Extender: [name] in Treatment: 7 Information Obtained from: Patient Chief Complaint 10/30/2021; left lower extremity wounds, back wounds Electronic Signature(s) Signed: 12/18/2021 3:06:11 PM By: Robert Corwin DO Entered By: [name] on 12/18/2021 15:02:25 [name] ([id number]) -------------------------------------------------------------------------------- HPI Details Patient Name: [name] Date of Service: 12/18/2021 2:15 PM Medical Record Number: [id number] Patient Account Number: [id number] Date of Birth/Sex: June 27, 1985 (35 y.o. M) Treating RN: [name] Primary Care Provider: Unknown [name] Other Clinician: Betha [name] Referring Provider: Unknown [name] Treating Provider/Extender: [name] in Treatment: 7 History of Present Illness HPI Description: 03/18/2021 upon evaluation today patient presents for initial inspection here in the clinic concerning issues that he has been having with his back. Fortunately this seems to be related to multiple potential issues here which involve moisture, pressure, and I believe a fungal infection as well. In the past he seems to have responded to steroids topically. With that being said I am thinking that might still be something that could be helpful for him. I am going to see about setting this and to the pharmacy to get things started. I also think we may want to do a wound culture in order to evaluate for any possibilities there. 04/19/2021 upon evaluation today patient actually appears to  be doing excellent in regard to his back. He is actually making wonderful progress as far as this is concerned. I do not see any signs of infection at this time which is great news and overall I think that he is doing quite well. Admission 10/30/2021 [name] is a 36 year old male with a past medical history of paraplegia that presents with extensive wounds to his back and left leg. He was seen in our clinic earlier in the year for a similar presentation but limited to the back area. This was treated effectively with antifungal spray, triamcinolone cream and oral antibiotics. He again presents today with similar wounds to his back. Unfortunately he has also developed wounds to his left lower extremity. He states that the wounds started after he was casted for a broken leg. He has also developed wounds to the left upper thigh. He states he was sunburned to this area and subsequently developed wounds. He has currently been keeping the wounds covered. He denies systemic signs of infection. 8/2; patient presents for follow-up. Has been using triamcinolone cream with antifungal spray on the wound beds. He has been taking Augmentin without issues. He reports improvement in wound healing. 8/16; patient presents for follow-up. He continues to use triamcinolone cream with antifungal spray to the wound beds. He has completed his course of Augmentin. He continues to report improvement in wound healing. He has no issues or complaints today. 9/13; patient presents for follow-up. He continues to use triamcinolone cream with antifungal spray with benefit to the wound healing. He developed a small skin tear to the right lower leg today by hitting this against an object. He has no issues or complaints today. He has not heard from dermatology from referral placed at first clinic visit. Electronic Signature(s) Signed: 12/18/2021 3:06:11 PM By: Robert Corwin DO Entered By: [name] on 12/18/2021  15:03:06 [name] ([id number]) -------------------------------------------------------------------------------- Physical Exam Details  Patient Name: [name] Date of Service: 12/18/2021 2:15 PM Medical Record Number: [id number] Patient Account Number: [id number] Date of Birth/Sex: [birth year] (36 y.o. M) Treating RN: [name] Primary Care Provider: Unknown [name] Other Clinician: Betha [name] Referring Provider: Unknown [name] Treating Provider/Extender: [name] in Treatment: 7 Constitutional . Cardiovascular . Psychiatric . Notes Back: Multiple open wounds with granulation tissue throughout. Left lower extremity: Small scattered open wounds with granulation tissue present. No signs of infection. Right lower extremity: Small wound limited to skin breakdown to the anterior aspect. Electronic Signature(s) Signed: 12/18/2021 3:06:11 PM By: Robert Corwin DO Entered By: [name] on 12/18/2021 15:03:41 [name] ([id number]) -------------------------------------------------------------------------------- Physician Orders Details Patient Name: [name] Date of Service: 12/18/2021 2:15 PM Medical Record Number: [id number] Patient Account Number: [id number] Date of Birth/Sex: 14-Jul-1985 (35 y.o. M) Treating RN: [name] Primary Care Provider: Unknown [name] Other Clinician: Betha [name] Referring Provider: Unknown [name] Treating Provider/Extender: [name] in Treatment: 7 Verbal / Phone Orders: No Diagnosis Coding Follow-up Appointments o Return Appointment in 1 week. Bathing/ Shower/ Hygiene o May shower; gently cleanse wound with antibacterial soap, rinse and pat dry prior to dressing wounds Wound Treatment Wound #3 - Upper Leg Wound Laterality: Left Cleanser: Wound Cleanser 1 x Per Day/30 Days Discharge Instructions: Wash your hands with soap and water. Remove old dressing, discard into plastic bag and place into  trash. Cleanse the wound with Wound Cleanser prior to applying a clean dressing using gauze sponges, not tissues or cotton balls. Do not scrub or use excessive force. Pat dry using gauze sponges, not tissue or cotton balls. Topical: Triamcinolone Acetonide Cream, 0.1%, 15 (g) tube 1 x Per Day/30 Days Discharge Instructions: Apply as directed by provider. Topical: Nystatin Cream, 30 (g) tube 1 x Per Day/30 Days Discharge Instructions: in office spray at home Secondary Dressing: ABD Pad 5x9 (in/in) 1 x Per Day/30 Days Discharge Instructions: Cover with ABD pad Secured With: Medipore Tape - 7M Medipore H Soft Cloth Surgical Tape, 2x2 (in/yd) 1 x Per Day/30 Days Wound #4 - Lower Leg Wound Laterality: Left, Circumferential Cleanser: Wound Cleanser 1 x Per Day/30 Days Discharge Instructions: Wash your hands with soap and water. Remove old dressing, discard into plastic bag and place into trash. Cleanse the wound with Wound Cleanser prior to applying a clean dressing using gauze sponges, not tissues or cotton balls. Do not scrub or use excessive force. Pat dry using gauze sponges, not tissue or cotton balls. Topical: Triamcinolone Acetonide Cream, 0.1%, 15 (g) tube 1 x Per Day/30 Days Discharge Instructions: Apply as directed by provider. Topical: Nystatin Cream, 30 (g) tube 1 x Per Day/30 Days Discharge Instructions: in office spray at home Secondary Dressing: Kerlix 4.5 x 4.1 (in/yd) 1 x Per Day/30 Days Discharge Instructions: Apply Kerlix 4.5 x 4.1 (in/yd) as instructed Secured With: Medipore Tape - 7M Medipore H Soft Cloth Surgical Tape, 2x2 (in/yd) 1 x Per Day/30 Days Wound #6 - Back Cleanser: Wound Cleanser 1 x Per Day/30 Days Discharge Instructions: Wash your hands with soap and water. Remove old dressing, discard into plastic bag and place into trash. Cleanse the wound with Wound Cleanser prior to applying a clean dressing using gauze sponges, not tissues or cotton balls. Do not scrub or  use excessive force. Pat dry using gauze sponges, not tissue or cotton balls. Topical: Triamcinolone Acetonide Cream, 0.1%, 15 (g) tube 1 x Per Day/30 Days Discharge Instructions: Apply as directed by provider. Topical: Nystatin Cream, 30 (g)  tube 1 x Per Day/30 Days Discharge Instructions: in office spray at home Secondary Dressing: Gauze 1 x Per Day/30 Days [name], [name] ([id number]) Discharge Instructions: As directed: dry, moistened with saline or moistened with Dakins Solution Secured With: Medipore Tape - 28M Medipore H Soft Cloth Surgical Tape, 2x2 (in/yd) 1 x Per Day/30 Days Wound #7 - Lower Leg Wound Laterality: Right, Anterior Cleanser: Wound Cleanser 1 x Per Day/30 Days Discharge Instructions: Wash your hands with soap and water. Remove old dressing, discard into plastic bag and place into trash. Cleanse the wound with Wound Cleanser prior to applying a clean dressing using gauze sponges, not tissues or cotton balls. Do not scrub or use excessive force. Pat dry using gauze sponges, not tissue or cotton balls. Topical: Triamcinolone Acetonide Cream, 0.1%, 15 (g) tube 1 x Per Day/30 Days Discharge Instructions: Apply as directed by provider. Topical: Nystatin Cream, 30 (g) tube 1 x Per Day/30 Days Discharge Instructions: in office spray at home Secondary Dressing: Gauze 1 x Per Day/30 Days Discharge Instructions: As directed: dry, moistened with saline or moistened with Dakins Solution Secured With: Medipore Tape - 28M Medipore H Soft Cloth Surgical Tape, 2x2 (in/yd) 1 x Per Day/30 Days Patient Medications Allergies: No Known Allergies Notifications Medication Indication Start End triamcinolone acetonide 12/18/2021 DOSE 1 - topical 0.1 % cream - Apply daily to the wound beds Electronic Signature(s) Signed: 12/18/2021 4:01:33 PM By: Robert Corwin DO Previous Signature: 12/18/2021 3:08:50 PM Version By: Robert Corwin DO Previous Signature: 12/18/2021 3:06:11 PM Version By: Robert Corwin DO Entered By: [name] on 12/18/2021 15:09:02 [name] ([id number]) -------------------------------------------------------------------------------- Problem List Details Patient Name: [name] Date of Service: 12/18/2021 2:15 PM Medical Record Number: [id number] Patient Account Number: [id number] Date of Birth/Sex: March 27, 1986 (35 y.o. 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 10/30/2021 No Yes L97.829 Non-pressure chronic ulcer of other part of left lower leg with 10/30/2021 No Yes unspecified severity B35.4 Tinea corporis 10/30/2021 No Yes L98.8 Other specified disorders of the skin and subcutaneous tissue 10/30/2021 No Yes G82.22 Paraplegia, incomplete 10/30/2021 No Yes Inactive Problems Resolved Problems Electronic Signature(s) Signed: 12/18/2021 3:06:11 PM By: Robert Corwin DO Entered By: [name] on 12/18/2021 15:02:21 [name] ([id number]) -------------------------------------------------------------------------------- Progress Note Details Patient Name: [name] Date of Service: 12/18/2021 2:15 PM Medical Record Number: [id number] Patient Account Number: [id number] Date of Birth/Sex: 26-Dec-1985 (35 y.o. M) Treating RN: [name] Primary Care Provider: Unknown [name] Other Clinician: Betha [name] Referring Provider: Unknown [name] Treating Provider/Extender: [name] in Treatment: 7 Subjective Chief Complaint Information obtained from Patient 10/30/2021; left lower extremity wounds, back wounds History of Present Illness (HPI) 03/18/2021 upon evaluation today patient presents for initial inspection here in the clinic  concerning issues that he has been having with his back. Fortunately this seems to be related to multiple potential issues here which involve moisture, pressure, and I believe a fungal infection as well. In the past he seems to have responded to steroids topically. With that being said I am thinking that might still be something that could be helpful for him. I am going to see about setting this and to the pharmacy to get things started. I also think we may want to do a wound culture in order to evaluate  for any possibilities there. 04/19/2021 upon evaluation today patient actually appears to be doing excellent in regard to his back. He is actually making wonderful progress as far as this is concerned. I do not see any signs of infection at this time which is great news and overall I think that he is doing quite well. Admission 10/30/2021 Mr. Neldon Shepard is a 36 year old male with a past medical history of paraplegia that presents with extensive wounds to his back and left leg. He was seen in our clinic earlier in the year for a similar presentation but limited to the back area. This was treated effectively with antifungal spray, triamcinolone cream and oral antibiotics. He again presents today with similar wounds to his back. Unfortunately he has also developed wounds to his left lower extremity. He states that the wounds started after he was casted for a broken leg. He has also developed wounds to the left upper thigh. He states he was sunburned to this area and subsequently developed wounds. He has currently been keeping the wounds covered. He denies systemic signs of infection. 8/2; patient presents for follow-up. Has been using triamcinolone cream with antifungal spray on the wound beds. He has been taking Augmentin without issues. He reports improvement in wound healing. 8/16; patient presents for follow-up. He continues to use triamcinolone cream with antifungal spray to the wound beds. He has  completed his course of Augmentin. He continues to report improvement in wound healing. He has no issues or complaints today. 9/13; patient presents for follow-up. He continues to use triamcinolone cream with antifungal spray with benefit to the wound healing. He developed a small skin tear to the right lower leg today by hitting this against an object. He has no issues or complaints today. He has not heard from dermatology from referral placed at first clinic visit. Objective Constitutional Vitals Time Taken: 2:26 PM, Height: 73 in, Weight: 185 lbs, BMI: 24.4, Temperature: 98.2 F, Pulse: 72 bpm, Respiratory Rate: 16 breaths/min, Blood Pressure: 106/74 mmHg. General Notes: Back: Multiple open wounds with granulation tissue throughout. Left lower extremity: Small scattered open wounds with granulation tissue present. No signs of infection. Right lower extremity: Small wound limited to skin breakdown to the anterior aspect. Integumentary (Hair, Skin) Wound #3 status is Open. Original cause of wound was Other Lesion. The date acquired was: 08/05/2021. The wound has been in treatment 7 weeks. The wound is located on the Left Upper Leg. The wound measures 0.1cm length x 0.1cm width x 0.1cm depth; 0.008cm^2 area and 0.001cm^3 volume. There is Fat Layer (Subcutaneous Tissue) exposed. There is a medium amount of sanguinous drainage noted. There is large (67-100%) friable, hyper - granulation within the wound bed. There is no necrotic tissue within the wound bed. Wound #4 status is Open. Original cause of wound was Gradually Appeared. The date acquired was: 08/05/2021. The wound has been in treatment 7 weeks. The wound is located on the Left,Circumferential Lower Leg. The wound measures 0.1cm length x 0.1cm width x 0.1cm depth; 0.008cm^2 area and 0.001cm^3 volume. There is Fat Layer (Subcutaneous Tissue) exposed. There is a medium amount of sanguinous drainage noted. There is large (67-100%) red, friable,  hyper - granulation within the wound bed. Wound #5 status is Healed - Epithelialized. Original cause of wound was Gradually Appeared. The date acquired was: 07/06/2021. The wound has [name], [name] ([id number]) been in treatment 7 weeks. The wound is located on the Anterior Foot. The wound measures 0cm length x 0cm width x  0cm depth; 0cm^2 area and 0cm^3 volume. There is Fat Layer (Subcutaneous Tissue) exposed. There is a medium amount of sanguinous drainage noted. There is small (1-33%) red, friable, hyper - granulation within the wound bed. There is a large (67-100%) amount of necrotic tissue within the wound bed. Wound #6 status is Open. Original cause of wound was Gradually Appeared. The date acquired was: 07/06/2021. The wound has been in treatment 7 weeks. The wound is located on the Back. The wound measures 30cm length x 21.4cm width x 0.1cm depth; 504.226cm^2 area and 50.423cm^3 volume. There is Fat Layer (Subcutaneous Tissue) exposed. There is a medium amount of sanguinous drainage noted. There is large (67-100%) red, friable, hyper - granulation within the wound bed. There is no necrotic tissue within the wound bed. Wound #7 status is Open. Original cause of wound was Skin Tear/Laceration. The date acquired was: 12/18/2021. The wound is located on the Right,Anterior Lower Leg. The wound measures 0.5cm length x 0.7cm width x 0.1cm depth; 0.275cm^2 area and 0.027cm^3 volume. There is Fat Layer (Subcutaneous Tissue) exposed. There is no tunneling or undermining noted. There is a medium amount of serosanguineous drainage noted. The wound margin is flat and intact. There is small (1-33%) red granulation within the wound bed. There is no necrotic tissue within the wound bed. 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 I recommended continuing triamcinolone cream and antifungal spray to all wound beds except for the right lower extremity can use Xeroform. Patient has a dermatological condition and I would like for him to be evaluated by a dermatologist to assure there is nothing further to do. We will resend the referral to dermatology. Plan Follow-up Appointments: Return Appointment in 1 week. Bathing/ Shower/ Hygiene: May shower; gently cleanse wound with antibacterial soap, rinse and pat dry prior to dressing wounds WOUND #3: - Upper Leg Wound Laterality: Left Cleanser: Wound Cleanser 1 x Per Day/30 Days Discharge Instructions: Wash your hands with soap and water. Remove old dressing, discard into plastic bag and place into trash. Cleanse the wound with Wound Cleanser prior to applying a clean dressing using gauze sponges, not tissues or cotton balls. Do not scrub or use excessive force. Pat dry using gauze sponges, not tissue or cotton balls. Topical: Triamcinolone Acetonide Cream, 0.1%, 15 (g) tube 1 x Per Day/30 Days Discharge Instructions: Apply as directed by provider. Topical: Nystatin Cream, 30 (g) tube 1 x Per Day/30 Days Discharge Instructions: in office spray at home Secondary Dressing: ABD Pad 5x9 (in/in) 1 x Per Day/30 Days Discharge Instructions: Cover with ABD pad Secured With: Medipore Tape - 36M Medipore H Soft Cloth Surgical Tape, 2x2 (in/yd) 1 x Per Day/30 Days WOUND #4: - Lower Leg Wound Laterality: Left, Circumferential Cleanser: Wound Cleanser 1 x Per Day/30 Days Discharge Instructions: Wash your hands with soap and water. Remove old dressing, discard into plastic bag and place into trash. Cleanse the wound with Wound Cleanser prior to applying a clean dressing using gauze sponges, not tissues or cotton balls. Do not scrub or use excessive force. Pat dry using gauze sponges, not tissue or  cotton balls. Topical: Triamcinolone Acetonide Cream, 0.1%, 15 (g) tube 1 x Per Day/30 Days Discharge Instructions: Apply as directed by provider. Topical: Nystatin Cream, 30 (g) tube 1 x  Per Day/30 Days Discharge Instructions: in office spray at home Secondary Dressing: Kerlix 4.5 x 4.1 (in/yd) 1 x Per Day/30 Days Discharge Instructions: Apply Kerlix 4.5 x 4.1 (in/yd) as instructed Secured With: Medipore Tape - 78M Medipore H Soft Cloth Surgical Tape, 2x2 (in/yd) 1 x Per Day/30 Days WOUND #6: - Back Wound Laterality: Cleanser: Wound Cleanser 1 x Per Day/30 Days Discharge Instructions: Wash your hands with soap and water. Remove old dressing, discard into plastic bag and place into trash. Cleanse the wound with Wound Cleanser prior to applying a clean dressing using gauze sponges, not tissues or cotton balls. Do not scrub or use excessive force. Pat dry using gauze sponges, not tissue or cotton balls. Topical: Triamcinolone Acetonide Cream, 0.1%, 15 (g) tube 1 x Per Day/30 Days Discharge Instructions: Apply as directed by provider. Topical: Nystatin Cream, 30 (g) tube 1 x Per Day/30 Days [name], [name] ([id number]) Discharge Instructions: in office spray at home Secondary Dressing: Gauze 1 x Per Day/30 Days Discharge Instructions: As directed: dry, moistened with saline or moistened with Dakins Solution Secured With: Medipore Tape - 78M Medipore H Soft Cloth Surgical Tape, 2x2 (in/yd) 1 x Per Day/30 Days WOUND #7: - Lower Leg Wound Laterality: Right, Anterior Cleanser: Wound Cleanser 1 x Per Day/30 Days Discharge Instructions: Wash your hands with soap and water. Remove old dressing, discard into plastic bag and place into trash. Cleanse the wound with Wound Cleanser prior to applying a clean dressing using gauze sponges, not tissues or cotton balls. Do not scrub or use excessive force. Pat dry using gauze sponges, not tissue or cotton balls. Topical: Triamcinolone Acetonide Cream, 0.1%, 15  (g) tube 1 x Per Day/30 Days Discharge Instructions: Apply as directed by provider. Topical: Nystatin Cream, 30 (g) tube 1 x Per Day/30 Days Discharge Instructions: in office spray at home Secondary Dressing: Gauze 1 x Per Day/30 Days Discharge Instructions: As directed: dry, moistened with saline or moistened with Dakins Solution Secured With: Medipore Tape - 78M Medipore H Soft Cloth Surgical Tape, 2x2 (in/yd) 1 x Per Day/30 Days 1. Triamcinolone cream with antifungal spray 2. Follow-up dermatology referral 3. Follow-up in 1 month Electronic Signature(s) Signed: 12/18/2021 3:06:11 PM By: Robert Corwin DO Entered By: [name] on 12/18/2021 15:05:01 [name] ([id number]) -------------------------------------------------------------------------------- ROS/PFSH Details Patient Name: [name] Date of Service: 12/18/2021 2:15 PM Medical Record Number: [id number] Patient Account Number: [id number] Date of Birth/Sex: July 08, 1985 (35 y.o. M) Treating RN: [name] Primary Care Provider: Unknown [name] Other Clinician: Betha [name] Referring Provider: Unknown [name] Treating Provider/Extender: [name] in Treatment: 7 Information Obtained From Patient Neurologic Medical History: Positive for: Quadriplegia Immunizations Pneumococcal Vaccine: Received Pneumococcal Vaccination: No Implantable Devices None Family and Social History Former smoker; Marital Status - Divorced; Alcohol Use: Never; Drug Use: Current History - CBD; Caffeine Use: Daily; Financial Concerns: No; Food, Clothing or Shelter Needs: No; Support System Lacking: No; Transportation Concerns: No Electronic Signature(s) Signed: 12/18/2021 4:01:33 PM By: Robert Corwin DO Signed: 12/19/2021 11:19:49 AM By: Elliot Gurney, BSN, RN, CWS, Kim RN, BSN Previous Signature: 12/18/2021 3:06:11 PM Version By: Robert Corwin DO Entered By: [name] on 12/18/2021 15:09:11 [name]  ([id number]) -------------------------------------------------------------------------------- SuperBill Details Patient Name: [name] Date of Service: 12/18/2021 Medical Record Number: [id number] Patient Account Number: [id number] Date of Birth/Sex: [birth year] (35 y.o. 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 L97.829 Non-pressure chronic ulcer of other part of left lower leg with unspecified severity B35.4 Tinea corporis L98.8 Other specified disorders of the skin and subcutaneous tissue G82.22 Paraplegia, incomplete Facility Procedures CPT4 Code: 62703500 Description: 99214 - WOUND CARE VISIT-LEV 4 EST PT Modifier: Quantity: 1 Physician Procedures CPT4 Code: [id number] Description: 99213 - WC PHYS LEVEL 3 - EST PT Modifier: Quantity: 1 CPT4 Code: Description: ICD-10 Diagnosis Description 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

## 2022-01-15 ENCOUNTER — Encounter: Payer: Medicare Other | Attending: Internal Medicine | Admitting: Internal Medicine

## 2022-01-15 DIAGNOSIS — G8222 Paraplegia, incomplete: Secondary | ICD-10-CM

## 2022-01-15 DIAGNOSIS — L98422 Non-pressure chronic ulcer of back with fat layer exposed: Secondary | ICD-10-CM | POA: Insufficient documentation

## 2022-01-15 DIAGNOSIS — L97829 Non-pressure chronic ulcer of other part of left lower leg with unspecified severity: Secondary | ICD-10-CM | POA: Diagnosis not present

## 2022-01-15 DIAGNOSIS — L988 Other specified disorders of the skin and subcutaneous tissue: Secondary | ICD-10-CM | POA: Diagnosis not present

## 2022-01-15 DIAGNOSIS — B354 Tinea corporis: Secondary | ICD-10-CM | POA: Insufficient documentation

## 2022-01-15 DIAGNOSIS — S81811A Laceration without foreign body, right lower leg, initial encounter: Secondary | ICD-10-CM | POA: Diagnosis not present

## 2022-01-15 NOTE — Progress Notes (Addendum)
SHIVAN, HODES ([id number]) Visit Report for 01/15/2022 Chief Complaint Document Details Patient Name: [name], [name] Date of Service: 01/15/2022 2:15 PM Medical Record Number: [id number] Patient Account Number: [id number] Date of Birth/Sex: 1985/04/27 (36 y.o. M) Treating RN: Yevonne Pax Primary Care Provider: Unknown Foley Other Clinician: Betha Loa Referring Provider: Unknown Foley Treating Provider/Extender: Tilda Franco in Treatment: 11 Information Obtained from: Patient Chief Complaint 10/30/2021; left lower extremity wounds, back wounds Electronic Signature(s) Signed: 01/15/2022 3:09:38 PM By: Geralyn Corwin DO Entered By: Geralyn Corwin on 01/15/2022 15:04:55 [name] ([id number]) -------------------------------------------------------------------------------- HPI Details Patient Name: [name] Date of Service: 01/15/2022 2:15 PM Medical Record Number: [id number] Patient Account Number: [id number] Date of Birth/Sex: [birth year] (35 y.o. M) Treating RN: Yevonne Pax Primary Care Provider: Unknown Foley Other Clinician: Betha Loa Referring Provider: Unknown Foley Treating Provider/Extender: Tilda Franco in Treatment: 11 History of Present Illness HPI Description: 03/18/2021 upon evaluation today patient presents for initial inspection here in the clinic concerning issues that he has been having with his back. Fortunately this seems to be related to multiple potential issues here which involve moisture, pressure, and I believe a fungal infection as well. In the past he seems to have responded to steroids topically. With that being said I am thinking that might still be something that could be helpful for him. I am going to see about setting this and to the pharmacy to get things started. I also think we may want to do a wound culture in order to evaluate for any possibilities there. 04/19/2021 upon evaluation today patient actually  appears to be doing excellent in regard to his back. He is actually making wonderful progress as far as this is concerned. I do not see any signs of infection at this time which is great news and overall I think that he is doing quite well. Admission 10/30/2021 [name] is a 36 year old male with a past medical history of paraplegia that presents with extensive wounds to his back and left leg. He was seen in our clinic earlier in the year for a similar presentation but limited to the back area. This was treated effectively with antifungal spray, triamcinolone cream and oral antibiotics. He again presents today with similar wounds to his back. Unfortunately he has also developed wounds to his left lower extremity. He states that the wounds started after he was casted for a broken leg. He has also developed wounds to the left upper thigh. He states he was sunburned to this area and subsequently developed wounds. He has currently been keeping the wounds covered. He denies systemic signs of infection. 8/2; patient presents for follow-up. Has been using triamcinolone cream with antifungal spray on the wound beds. He has been taking Augmentin without issues. He reports improvement in wound healing. 8/16; patient presents for follow-up. He continues to use triamcinolone cream with antifungal spray to the wound beds. He has completed his course of Augmentin. He continues to report improvement in wound healing. He has no issues or complaints today. 9/13; patient presents for follow-up. He continues to use triamcinolone cream with antifungal spray with benefit to the wound healing. He developed a small skin tear to the right lower leg today by hitting this against an object. He has no issues or complaints today. He has not heard from dermatology from referral placed at first clinic visit. 10/11; patient presents for follow-up. He continues to use triamcinolone cream with antifungal spray to the wound  beds with benefit in wound healing. He  is scheduled to see dermatology in February. He has no issues or complaints today. Electronic Signature(s) Signed: 01/15/2022 3:09:38 PM By: Geralyn Corwin DO Entered By: Geralyn Corwin on 01/15/2022 15:05:20 [name] ([id number]) -------------------------------------------------------------------------------- Physical Exam Details Patient Name: [name] Date of Service: 01/15/2022 2:15 PM Medical Record Number: [id number] Patient Account Number: [id number] Date of Birth/Sex: 1985/07/29 (35 y.o. M) Treating RN: Yevonne Pax Primary Care Provider: Unknown Foley Other Clinician: Betha Loa Referring Provider: Unknown Foley Treating Provider/Extender: Tilda Franco in Treatment: 11 Constitutional . Cardiovascular . Psychiatric . Notes Back: Multiple open wounds with granulation tissue throughout. Left lower extremity: Small scattered open wounds with granulation tissue present. No signs of infection. Right lower extremity: Epithelization to the previous wound site. Electronic Signature(s) Signed: 01/15/2022 3:09:38 PM By: Geralyn Corwin DO Entered By: Geralyn Corwin on 01/15/2022 15:06:15 [name] ([id number]) -------------------------------------------------------------------------------- Physician Orders Details Patient Name: [name] Date of Service: 01/15/2022 2:15 PM Medical Record Number: [id number] Patient Account Number: [id number] Date of Birth/Sex: [birth year] (35 y.o. M) Treating RN: Yevonne Pax Primary Care Provider: Unknown Foley Other Clinician: Betha Loa Referring Provider: Unknown Foley Treating Provider/Extender: Tilda Franco in Treatment: 21 Verbal / Phone Orders: No Diagnosis Coding Follow-up Appointments o Return Appointment in 1 week. Bathing/ Shower/ Hygiene o May shower; gently cleanse wound with antibacterial soap, rinse and pat dry prior to dressing  wounds Wound Treatment Wound #3 - Upper Leg Wound Laterality: Left Cleanser: Wound Cleanser 1 x Per Day/30 Days Discharge Instructions: Wash your hands with soap and water. Remove old dressing, discard into plastic bag and place into trash. Cleanse the wound with Wound Cleanser prior to applying a clean dressing using gauze sponges, not tissues or cotton balls. Do not scrub or use excessive force. Pat dry using gauze sponges, not tissue or cotton balls. Topical: Triamcinolone Acetonide Cream, 0.1%, 15 (g) tube 1 x Per Day/30 Days Discharge Instructions: Apply as directed by provider. Topical: Nystatin Cream, 30 (g) tube 1 x Per Day/30 Days Discharge Instructions: in office spray at home Secondary Dressing: ABD Pad 5x9 (in/in) 1 x Per Day/30 Days Discharge Instructions: Cover with ABD pad Secured With: Medipore Tape - 54M Medipore H Soft Cloth Surgical Tape, 2x2 (in/yd) 1 x Per Day/30 Days Wound #4 - Lower Leg Wound Laterality: Left, Circumferential Cleanser: Wound Cleanser 1 x Per Day/30 Days Discharge Instructions: Wash your hands with soap and water. Remove old dressing, discard into plastic bag and place into trash. Cleanse the wound with Wound Cleanser prior to applying a clean dressing using gauze sponges, not tissues or cotton balls. Do not scrub or use excessive force. Pat dry using gauze sponges, not tissue or cotton balls. Topical: Triamcinolone Acetonide Cream, 0.1%, 15 (g) tube 1 x Per Day/30 Days Discharge Instructions: Apply as directed by provider. Topical: Nystatin Cream, 30 (g) tube 1 x Per Day/30 Days Discharge Instructions: in office spray at home Secondary Dressing: Kerlix 4.5 x 4.1 (in/yd) 1 x Per Day/30 Days Discharge Instructions: Apply Kerlix 4.5 x 4.1 (in/yd) as instructed Secured With: Medipore Tape - 54M Medipore H Soft Cloth Surgical Tape, 2x2 (in/yd) 1 x Per Day/30 Days Wound #6 - Back Cleanser: Wound Cleanser 1 x Per Day/30 Days Discharge Instructions: Wash your  hands with soap and water. Remove old dressing, discard into plastic bag and place into trash. Cleanse the wound with Wound Cleanser prior to applying a clean dressing using gauze sponges, not tissues or cotton balls. Do not scrub or use excessive force. Dennie Bible  dry using gauze sponges, not tissue or cotton balls. Topical: Triamcinolone Acetonide Cream, 0.1%, 15 (g) tube 1 x Per Day/30 Days Discharge Instructions: Apply as directed by provider. Topical: Nystatin Cream, 30 (g) tube 1 x Per Day/30 Days Discharge Instructions: in office spray at home Secondary Dressing: Gauze 1 x Per Day/30 Days [name], [name] ([id number]) Discharge Instructions: As directed: dry, moistened with saline or moistened with Dakins Solution Secured With: Medipore Tape - 57M Medipore H Soft Cloth Surgical Tape, 2x2 (in/yd) 1 x Per Day/30 Days Patient Medications Allergies: No Known Allergies Notifications Medication Indication Start End triamcinolone acetonide 01/15/2022 DOSE 1 - topical 0.1 % cream - Apply daily to the affected skin Electronic Signature(s) Signed: 01/15/2022 3:59:12 PM By: Geralyn Corwin DO Previous Signature: 01/15/2022 3:31:51 PM Version By: Geralyn Corwin DO Previous Signature: 01/15/2022 3:09:38 PM Version By: Geralyn Corwin DO Entered By: Geralyn Corwin on 01/15/2022 15:32:03 [name] ([id number]) -------------------------------------------------------------------------------- Problem List Details Patient Name: [name] Date of Service: 01/15/2022 2:15 PM Medical Record Number: [id number] Patient Account Number: [id number] Date of Birth/Sex: 01/23/1986 (35 y.o. M) Treating RN: Yevonne Pax Primary Care Provider: Unknown Foley Other Clinician: Betha Loa Referring Provider: Unknown Foley Treating Provider/Extender: Tilda Franco in Treatment: 11 Active Problems ICD-10 Encounter Code Description Active Date MDM Diagnosis [phone] Non-pressure chronic ulcer of  back with fat layer exposed 10/30/2021 No Yes L97.829 Non-pressure chronic ulcer of other part of left lower leg with 10/30/2021 No Yes unspecified severity B35.4 Tinea corporis 10/30/2021 No Yes L98.8 Other specified disorders of the skin and subcutaneous tissue 10/30/2021 No Yes G82.22 Paraplegia, incomplete 10/30/2021 No Yes Inactive Problems Resolved Problems Electronic Signature(s) Signed: 01/15/2022 3:09:38 PM By: Geralyn Corwin DO Entered By: Geralyn Corwin on 01/15/2022 15:04:47 [name] ([id number]) -------------------------------------------------------------------------------- Progress Note Details Patient Name: [name] Date of Service: 01/15/2022 2:15 PM Medical Record Number: [id number] Patient Account Number: [id number] Date of Birth/Sex: Apr 10, 1985 (35 y.o. M) Treating RN: Yevonne Pax Primary Care Provider: Unknown Foley Other Clinician: Betha Loa Referring Provider: Unknown Foley Treating Provider/Extender: Tilda Franco in Treatment: 11 Subjective Chief Complaint Information obtained from Patient 10/30/2021; left lower extremity wounds, back wounds History of Present Illness (HPI) 03/18/2021 upon evaluation today patient presents for initial inspection here in the clinic concerning issues that he has been having with his back. Fortunately this seems to be related to multiple potential issues here which involve moisture, pressure, and I believe a fungal infection as well. In the past he seems to have responded to steroids topically. With that being said I am thinking that might still be something that could be helpful for him. I am going to see about setting this and to the pharmacy to get things started. I also think we may want to do a wound culture in order to evaluate for any possibilities there. 04/19/2021 upon evaluation today patient actually appears to be doing excellent in regard to his back. He is actually making wonderful progress  as far as this is concerned. I do not see any signs of infection at this time which is great news and overall I think that he is doing quite well. Admission 10/30/2021 Mr. Winfrey Chillemi is a 36 year old male with a past medical history of paraplegia that presents with extensive wounds to his back and left leg. He was seen in our clinic earlier in the year for a similar presentation but limited to the back area. This was treated effectively with antifungal spray, triamcinolone cream and oral antibiotics. He again  presents today with similar wounds to his back. Unfortunately he has also developed wounds to his left lower extremity. He states that the wounds started after he was casted for a broken leg. He has also developed wounds to the left upper thigh. He states he was sunburned to this area and subsequently developed wounds. He has currently been keeping the wounds covered. He denies systemic signs of infection. 8/2; patient presents for follow-up. Has been using triamcinolone cream with antifungal spray on the wound beds. He has been taking Augmentin without issues. He reports improvement in wound healing. 8/16; patient presents for follow-up. He continues to use triamcinolone cream with antifungal spray to the wound beds. He has completed his course of Augmentin. He continues to report improvement in wound healing. He has no issues or complaints today. 9/13; patient presents for follow-up. He continues to use triamcinolone cream with antifungal spray with benefit to the wound healing. He developed a small skin tear to the right lower leg today by hitting this against an object. He has no issues or complaints today. He has not heard from dermatology from referral placed at first clinic visit. 10/11; patient presents for follow-up. He continues to use triamcinolone cream with antifungal spray to the wound beds with benefit in wound healing. He is scheduled to see dermatology in February. He has no  issues or complaints today. Objective Constitutional Vitals Time Taken: 2:34 PM, Height: 73 in, Weight: 185 lbs, BMI: 24.4, Temperature: 97.9 F, Pulse: 78 bpm, Respiratory Rate: 16 breaths/min, Blood Pressure: 114/81 mmHg. General Notes: Back: Multiple open wounds with granulation tissue throughout. Left lower extremity: Small scattered open wounds with granulation tissue present. No signs of infection. Right lower extremity: Epithelization to the previous wound site. Integumentary (Hair, Skin) Wound #3 status is Open. Original cause of wound was Other Lesion. The date acquired was: 08/05/2021. The wound has been in treatment 11 weeks. The wound is located on the Left Upper Leg. The wound measures 0.4cm length x 0.3cm width x 0.1cm depth; 0.094cm^2 area and 0.009cm^3 volume. There is Fat Layer (Subcutaneous Tissue) exposed. There is a medium amount of sanguinous drainage noted. There is large (67-100%) friable, hyper - granulation within the wound bed. There is no necrotic tissue within the wound bed. Wound #4 status is Open. Original cause of wound was Gradually Appeared. The date acquired was: 08/05/2021. The wound has been in treatment 11 weeks. The wound is located on the Left,Circumferential Lower Leg. The wound measures 5cm length x 5.5cm width x 0.1cm depth; 21.598cm^2 area and 2.16cm^3 volume. There is Fat Layer (Subcutaneous Tissue) exposed. There is a medium amount of sanguinous drainage [name], [name] ([id number]) noted. There is large (67-100%) red, friable, hyper - granulation within the wound bed. Wound #6 status is Open. Original cause of wound was Gradually Appeared. The date acquired was: 07/06/2021. The wound has been in treatment 11 weeks. The wound is located on the Back. The wound measures 30cm length x 26.5cm width x 0.1cm depth; 624.392cm^2 area and 62.439cm^3 volume. There is Fat Layer (Subcutaneous Tissue) exposed. There is a medium amount of sanguinous drainage noted. There is  large (67-100%) red, friable, hyper - granulation within the wound bed. There is no necrotic tissue within the wound bed. Wound #7 status is Healed - Epithelialized. Original cause of wound was Skin Tear/Laceration. The date acquired was: 12/18/2021. The wound has been in treatment 4 weeks. The wound is located on the Right,Anterior Lower Leg. The wound measures 0cm length x  0cm width x 0cm depth; 0cm^2 area and 0cm^3 volume. There is Fat Layer (Subcutaneous Tissue) exposed. There is a medium amount of serosanguineous drainage noted. The wound margin is flat and intact. There is small (1-33%) red granulation within the wound bed. There is no necrotic tissue within the wound bed. 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 He has an appointment with dermatology in February. We will see him back in 6 weeks. Plan Follow-up Appointments: Return Appointment in 1 week. Bathing/ Shower/ Hygiene: May shower; gently cleanse wound with antibacterial soap, rinse and pat dry prior to dressing wounds WOUND #3: - Upper Leg Wound Laterality: Left Cleanser: Wound Cleanser 1 x Per Day/30 Days Discharge Instructions: Wash your hands with soap and water. Remove old dressing, discard into plastic bag and place into trash. Cleanse the wound with Wound Cleanser prior to applying a clean dressing using gauze sponges, not tissues or cotton balls. Do not scrub or use excessive force. Pat dry using gauze sponges, not tissue or cotton balls. Topical: Triamcinolone Acetonide Cream, 0.1%, 15 (g) tube 1 x Per Day/30 Days Discharge Instructions: Apply as directed by provider. Topical: Nystatin Cream, 30 (g)  tube 1 x Per Day/30 Days Discharge Instructions: in office spray at home Secondary Dressing: ABD Pad 5x9 (in/in) 1 x Per Day/30 Days Discharge Instructions: Cover with ABD pad Secured With: Medipore Tape - 68M Medipore H Soft Cloth Surgical Tape, 2x2 (in/yd) 1 x Per Day/30 Days WOUND #4: - Lower Leg Wound Laterality: Left, Circumferential Cleanser: Wound Cleanser 1 x Per Day/30 Days Discharge Instructions: Wash your hands with soap and water. Remove old dressing, discard into plastic bag and place into trash. Cleanse the wound with Wound Cleanser prior to applying a clean dressing using gauze sponges, not tissues or cotton balls. Do not scrub or use excessive force. Pat dry using gauze sponges, not tissue or cotton balls. Topical: Triamcinolone Acetonide Cream, 0.1%, 15 (g) tube 1 x Per Day/30 Days Discharge Instructions: Apply as directed by provider. Topical: Nystatin Cream, 30 (g) tube 1 x Per Day/30 Days Discharge Instructions: in office spray at home Secondary Dressing: Kerlix 4.5 x 4.1 (in/yd) 1 x Per Day/30 Days Discharge Instructions: Apply Kerlix 4.5 x 4.1 (in/yd) as instructed Secured With: Medipore Tape - 68M Medipore H Soft Cloth Surgical Tape, 2x2 (in/yd) 1 x Per Day/30 Days WOUND #6: - Back Wound Laterality: Cleanser: Wound Cleanser 1 x Per Day/30 Days Discharge Instructions: Wash your hands with soap and water. Remove old dressing, discard into plastic bag and place into trash. Cleanse the wound with Wound Cleanser prior to applying a clean dressing using gauze sponges, not tissues or cotton balls. Do not scrub or use excessive force. Pat dry using gauze sponges, not tissue or cotton balls. Topical: Triamcinolone Acetonide Cream, 0.1%, 15 (g) tube 1 x Per Day/30 Days Discharge Instructions: Apply as directed by provider. Topical: Nystatin Cream, 30 (g) tube 1 x Per Day/30 Days Discharge Instructions: in office spray at home Secondary Dressing: Gauze 1 x Per Day/30  Days [name], [name] ([id number]) Discharge Instructions: As directed: dry, moistened with saline or moistened with Dakins Solution Secured With: Medipore Tape - 68M Medipore H Soft Cloth Surgical Tape, 2x2 (in/yd) 1 x Per Day/30 Days 1. Triamcinolone  cream and antifungal spray 2. Follow-up in 6 weeks Electronic Signature(s) Signed: 01/15/2022 3:09:38 PM By: Kalman Shan DO Entered By: Kalman Shan on 01/15/2022 15:07:30 [name] ([id number]) -------------------------------------------------------------------------------- SuperBill Details Patient Name: [name] Date of Service: 01/15/2022 Medical Record Number: [id number] Patient Account Number: [id number] Date of Birth/Sex: 1986-01-02 (35 y.o. 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 L97.829 Non-pressure chronic ulcer of other part of left lower leg with unspecified severity B35.4 Tinea corporis L98.8 Other specified disorders of the skin and subcutaneous tissue G82.22 Paraplegia, incomplete Facility Procedures CPT4 Code: 53614431 Description: 99214 - WOUND CARE VISIT-LEV 4 EST PT Modifier: Quantity: 1 Physician Procedures CPT4 Code: [id number] Description: 61950 - WC PHYS LEVEL 3 - EST PT Modifier: Quantity: 1 CPT4 Code: Description: ICD-10 Diagnosis Description 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

## 2022-01-23 NOTE — Progress Notes (Signed)
Robert [name] [name], Robert [name] [name] ([id number]) [id number] Page 1 of 13 Visit Report for 01/15/2022 Arrival Information Details Patient Name: Date of Service: Robert [name], [name] [facility] 01/15/2022 2:15 PM Medical Record Number: [id number] Patient Account Number: [id number] Date of Birth/Sex: Treating RN: 1985-10-30 (35 y.o. Judie Petit) Yevonne Pax Primary Care Quierra Silverio: Unknown Foley Other Clinician: Betha Loa Referring Sutton Hirsch: Treating Makynleigh Breslin/Extender: Howard Pouch Robert [name] Treatment: 11 Visit Information History Since Last Visit All ordered tests and consults were completed: No Patient Arrived: Wheel Chair Added or deleted any medications: No Arrival Time: 14:31 Any new allergies or adverse reactions: No Transfer Assistance: None Had a fall or experienced change Robert [name] No Patient Requires Transmission-Based Precautions: No activities of daily living that may affect Patient Has Alerts: No risk of falls: Hospitalized since last visit: No Pain Present Now: Yes Electronic Signature(s) Signed: 01/23/2022 11:24:03 AM By: Betha Loa Entered By: Betha Loa on 01/15/2022 14:33:35 -------------------------------------------------------------------------------- Clinic Level of Care Assessment Details Patient Name: Date of Service: [name], [name] 01/15/2022 2:15 PM Medical Record Number: [id number] Patient Account Number: [id number] Date of Birth/Sex: Treating RN: Apr 10, 1985 (35 y.o. Judie Petit) Yevonne Pax Primary Care Katrenia Alkins: Unknown Foley Other Clinician: Betha Loa Referring Juliana Boling: Treating Xiamara Hulet/Extender: Howard Pouch Robert [name] Treatment: 11 Clinic Level of Care Assessment Items TOOL 4 Quantity Score []  - 0 Use when only an EandM is performed on FOLLOW-UP visit ASSESSMENTS - Nursing Assessment / Reassessment X- 1 10 Reassessment of Co-morbidities (includes updates Robert [name] patient status) X- 1 5 Reassessment of Adherence to Treatment  Plan ASSESSMENTS - Wound and Skin A ssessment / Reassessment []  - 0 Simple Wound Assessment / Reassessment - one wound X- 3 5 Complex Wound Assessment / Reassessment - multiple wounds Tolle, ( ) [id number] Page 2 of 13 []  - 0 Dermatologic / Skin Assessment (not related to wound area) ASSESSMENTS - Focused Assessment []  - 0 Circumferential Edema Measurements - multi extremities []  - 0 Nutritional Assessment / Counseling / Intervention []  - 0 Lower Extremity Assessment (monofilament, tuning fork, pulses) []  - 0 Peripheral Arterial Disease Assessment (using hand held doppler) ASSESSMENTS - Ostomy and/or Continence Assessment and Care []  - 0 Incontinence Assessment and Management []  - 0 Ostomy Care Assessment and Management (repouching, etc.) PROCESS - Coordination of Care X - Simple Patient / Family Education for ongoing care 1 15 []  - 0 Complex (extensive) Patient / Family Education for ongoing care []  - 0 Staff obtains [id number], Records, T Results / Process Orders est []  - 0 Staff telephones HHA, Nursing Homes / Clarify orders / etc []  - 0 Routine Transfer to another Facility (non-emergent condition) []  - 0 Routine Hospital Admission (non-emergent condition) []  - 0 New Admissions / 05-03-1970 / Ordering NPWT Apligraf, etc. , []  - 0 Emergency Hospital Admission (emergent condition) X- 1 10 Simple Discharge Coordination []  - 0 Complex (extensive) Discharge Coordination PROCESS - Special Needs []  - 0 Pediatric / Minor Patient Management []  - 0 Isolation Patient Management []  - 0 Hearing / Language / Visual special needs []  - 0 Assessment of Community assistance (transportation, D/C planning, etc.) []  - 0 Additional assistance / Altered mentation []  - 0 Support Surface(s) Assessment (bed, cushion, seat, etc.) INTERVENTIONS - Wound Cleansing / Measurement []  - 0 Simple Wound Cleansing - one wound X- 3  5 Complex Wound Cleansing - multiple wounds X- 1 5 Wound Imaging (photographs - any number of wounds) []  - 0 Wound Tracing (instead of photographs) []  - 0 Simple Wound Measurement - one wound  X- 3 5 Complex Wound Measurement - multiple wounds INTERVENTIONS - Wound Dressings []  - 0 Small Wound Dressing one or multiple wounds X- 3 15 Medium Wound Dressing one or multiple wounds []  - 0 Large Wound Dressing one or multiple wounds X- 1 5 Application of Medications - topical []  - 0 Application of Medications - injection INTERVENTIONS - Miscellaneous []  - 0 External ear exam []  - 0 Specimen Collection (cultures, biopsies, blood, body fluids, etc.) []  - 0 Specimen(s) / Culture(s) sent or taken to Lab for analysis ROUTT, ( ) [id number] Page 3 of 13 []  - 0 Patient Transfer (multiple staff / / Similar devices) []  - 0 Simple Staple / Suture removal (25 or less) []  - 0 Complex Staple / Suture removal (26 or more) []  - 0 Hypo / Hyperglycemic Management (close monitor of Blood Glucose) []  - 0 Ankle / Brachial Index (ABI) - do not check if billed separately X- 1 5 Vital Signs Has the patient been seen at the hospital within the last three years: Yes Total Score: 145 Level Of Care: New/Established - Level 4 Electronic Signature(s) Signed: 01/23/2022 11:24:03 AM By: Franki Monte Entered By: [name] on 01/15/2022 15:04:06 -------------------------------------------------------------------------------- Encounter Discharge Information Details Patient Name: Date of Service: Robert [name], [name] 01/15/2022 2:15 PM Medical Record Number: Nurse, adult Patient Account Number: Date of Birth/Sex: Treating RN: February 18, 1986 (35 y.o. Primary Care Intisar Claudio: 01/25/2022 Other Clinician: Betha Loa Referring Darryl Blumenstein: Treating Ashawn Rinehart/Extender: Betha Loa Robert [name] Treatment:  11 Encounter Discharge Information Items Discharge Condition: Stable Ambulatory Status: Wheelchair Discharge Destination: Home Transportation: Private Auto Accompanied By: sister Schedule Follow-up Appointment: Yes Clinical Summary of Care: Electronic Signature(s) Signed: 01/23/2022 11:24:03 AM By: Robert [name] Pray Entered By: 03/17/2022 on 01/15/2022 15:16:17 -------------------------------------------------------------------------------- Lower Extremity Assessment Details Patient Name: Date of Service: Robert [name], [name] 01/15/2022 2:15 PM Medical Record Number: [id number] Patient Account Number: Melonie [place] Date of Birth/Sex: Treating RN: [birth year] (35 y.o. Howard Pouch Primary Care Eleanor Dimichele: 01/25/2022 Other Clinician: Betha Loa Referring Maiyah Goyne: Treating Jlen Wintle/Extender: Betha Loa [place], Robert [name] Pray (03/17/2022) [phone].pdf Page 4 of 13 Weeks Robert [name] Treatment: 11 Electronic Signature(s) Signed: 01/17/2022 12:25:01 PM By: 02/24/1986 RN Signed: 01/23/2022 11:24:03 AM By: Melonie [place] Entered By: Unknown Foley on 01/15/2022 14:51:00 -------------------------------------------------------------------------------- Multi Wound Chart Details Patient Name: Date of Service: Robert [name], [name] 01/15/2022 2:15 PM Medical Record Number: [name] Patient Account Number: [id number] Date of Birth/Sex: Treating RN: [birth year] (35 y.o. Yevonne Pax) 01/25/2022 Primary Care Reinette Cuneo: Betha Loa Other Clinician: Betha Loa Referring Anelis Hrivnak: Treating Haliyah Fryman/Extender: 03/17/2022 Robert [name] Treatment: 11 Vital Signs Height(Robert [name]): 73 Pulse(bpm): 78 Weight(lbs): 185 Blood Pressure(mmHg): 114/81 Body Mass Index(BMI): 24.4 Temperature(F): 97.9 Respiratory Rate(breaths/min): 16 [3:Photos:] Left Upper Leg Left, Circumferential Lower Leg Back Wound Location: Other Lesion Gradually Appeared Gradually  Appeared Wounding Event: Atypical Atypical Atypical Primary Etiology: Quadriplegia Quadriplegia Quadriplegia Comorbid History: 08/05/2021 08/05/2021 07/06/2021 Date Acquired: 11 11 11  Weeks of Treatment: Open Open Open Wound Status: No No No Wound Recurrence: 0.4x0.3x0.1 5x5.5x0.1 30x26.5x0.1 Measurements L x W x D (cm) 0.094 21.598 624.392 A (cm) : rea 0.009 2.16 62.439 Volume (cm) : 99.90% 95.80% 19.70% % Reduction Robert [name] A rea: 99.90% 95.80% 19.70% % Reduction Robert [name] Volume: Full Thickness Without Exposed Full Thickness Without Exposed Full Thickness Without Exposed Classification: Support Structures Support Structures Support Structures Medium Medium Medium Exudate Amount: Sanguinous Sanguinous Sanguinous Exudate Type: red red red Exudate Color: N/A N/A N/A Wound Margin: Large (  67-100%) Large (67-100%) Large (67-100%) Granulation Amount: Hyper-granulation, Friable Red, Hyper-granulation, Friable Red, Hyper-granulation, Friable Granulation Quality: None Present (0%) N/A None Present (0%) Necrotic Amount: Fat Layer (Subcutaneous Tissue): Yes Fat Layer (Subcutaneous Tissue): Yes Fat Layer (Subcutaneous Tissue): Yes Exposed Structures: Fascia: No Fascia: No Fascia: No Tendon: No Tendon: No Tendon: No Muscle: No Muscle: No Muscle: No Joint: No Joint: No Joint: No Bone: No Bone: No Bone: No None None None Epithelialization: Rico, Elex ([id number]) [id number] Page 5 of 13 Wound Number: 7 N/A N/A Photos: N/A N/A Right, Anterior Lower Leg N/A N/A Wound Location: Skin T ear/Laceration N/A N/A Wounding Event: Skin T ear N/A N/A Primary Etiology: Quadriplegia N/A N/A Comorbid History: 12/18/2021 N/A N/A Date Acquired: 4 N/A N/A Weeks of Treatment: Open N/A N/A Wound Status: No N/A N/A Wound Recurrence: 0.1x0.1x0.1 N/A N/A Measurements L x W x D (cm) 0.008 N/A N/A A (cm) : rea 0.001 N/A N/A Volume (cm) : 97.10% N/A N/A %  Reduction Robert [name] A rea: 96.30% N/A N/A % Reduction Robert [name] Volume: Full Thickness Without Exposed N/A N/A Classification: Support Structures Medium N/A N/A Exudate Amount: Serosanguineous N/A N/A Exudate Type: red, brown N/A N/A Exudate Color: Flat and Intact N/A N/A Wound Margin: Small (1-33%) N/A N/A Granulation Amount: Red N/A N/A Granulation Quality: None Present (0%) N/A N/A Necrotic Amount: Fat Layer (Subcutaneous Tissue): Yes N/A N/A Exposed Structures: Fascia: No Tendon: No Muscle: No Joint: No Bone: No None N/A N/A Epithelialization: Treatment Notes Electronic Signature(s) Signed: 01/23/2022 11:24:03 AM By: Massie Kluver Entered By: Massie Kluver on 01/15/2022 14:51:17 -------------------------------------------------------------------------------- Multi-Disciplinary Care Plan Details Patient Name: Date of Service: Robert [name], [name] 01/15/2022 2:15 PM Medical Record Number: [id number] Patient Account Number: [id number] Date of Birth/Sex: Treating RN: 1985-10-04 (35 y.o. Oval Linsey Primary Care Kerrington Greenhalgh: August Luz Other Clinician: Massie Kluver Referring Cleave Ternes: Treating Tyrell Brereton/Extender: Freda Munro Weeks Robert [name] Treatment: 11 Active Inactive Wound/Skin Impairment Nursing Diagnoses: Knowledge deficit related to ulceration/compromised skin integrity Portilla, Barbaraann Rondo ([id number]) [id number] Page 6 of 13 Goals: Patient/caregiver will verbalize understanding of skin care regimen Date Initiated: 10/30/2021 Target Resolution Date: 11/30/2021 Goal Status: Active Ulcer/skin breakdown will have a volume reduction of 30% by week 4 Date Initiated: 10/30/2021 Target Resolution Date: 11/30/2021 Goal Status: Active Ulcer/skin breakdown will have a volume reduction of 50% by week 8 Date Initiated: 10/30/2021 Target Resolution Date: 12/31/2021 Goal Status: Active Ulcer/skin breakdown will have a volume reduction of 80% by  week 12 Date Initiated: 10/30/2021 Target Resolution Date: 01/14/2022 Goal Status: Active Ulcer/skin breakdown will heal within 14 weeks Date Initiated: 10/30/2021 Target Resolution Date: 03/02/2022 Goal Status: Active Interventions: Assess patient/caregiver ability to obtain necessary supplies Assess patient/caregiver ability to perform ulcer/skin care regimen upon admission and as needed Assess ulceration(s) every visit Notes: Electronic Signature(s) Signed: 01/17/2022 12:25:01 PM By: Carlene Coria RN Signed: 01/23/2022 11:24:03 AM By: Massie Kluver Entered By: Massie Kluver on 01/15/2022 14:51:11 -------------------------------------------------------------------------------- Pain Assessment Details Patient Name: Date of Service: Robert [name], [name] 01/15/2022 2:15 PM Medical Record Number: [id number] Patient Account Number: [id number] Date of Birth/Sex: Treating RN: 1985/11/10 (35 y.o. Oval Linsey Primary Care Dong Nimmons: August Luz Other Clinician: Massie Kluver Referring Laron Boorman: Treating Alexsandro Salek/Extender: Marcell Anger Robert [name] Treatment: 11 Active Problems Location of Pain Severity and Description of Pain Patient Has Paino Yes Site Locations Pain Location: Pain Robert [name] Ulcers Duration of the Pain. Constant / Intermittento Intermittent Rate the pain. Current Pain Level: 4 Snowberger, Jeffry ([id number]) [id number] Page 7 of 13  Character of Pain Describe the Pain: Other: prickly tingling feeling Pain Management and Medication Current Pain Management: Medication: Yes Rest: Yes Electronic Signature(s) Signed: 01/17/2022 12:25:01 PM By: Yevonne Pax RN Signed: 01/23/2022 11:24:03 AM By: Betha Loa Entered By: Betha Loa on 01/15/2022 14:36:52 -------------------------------------------------------------------------------- Patient/Caregiver Education Details Patient Name: Date of Service: Robert [name], [name]  10/11/2023andnbsp2:15 PM Medical Record Number: [id number] Patient Account Number: [id number] Date of Birth/Gender: Treating RN: 1986-03-20 (35 y.o. Melonie [place] Primary Care Physician: Unknown Foley Other Clinician: Betha Loa Referring Physician: Treating Physician/Extender: Howard Pouch Robert [name] Treatment: 11 Education Assessment Education Provided To: Patient Education Topics Provided Wound/Skin Impairment: Handouts: Other: continue wound care as directed Methods: Explain/Verbal Responses: State content correctly Electronic Signature(s) Signed: 01/23/2022 11:24:03 AM By: Betha Loa Entered By: Betha Loa on 01/15/2022 15:13:27 -------------------------------------------------------------------------------- Wound Assessment Details Patient Name: Date of Service: Robert [name], [name] 01/15/2022 2:15 PM Medical Record Number: [id number] Patient Account Number: [id number] Date of Birth/Sex: Treating RN: Jun 21, 1985 (35 y.o. Melonie [place] Primary Care Hurley Blevins: Unknown Foley Other Clinician: Betha Loa [place], [name] ([id number]) [id number] Page 8 of 13 Referring Aymara Sassi: Treating Bohdan Macho/Extender: Penni Homans Weeks Robert [name] Treatment: 11 Wound Status Wound Number: 3 Primary Etiology: Atypical Wound Location: Left Upper Leg Wound Status: Open Wounding Event: Other Lesion Comorbid History: Quadriplegia Date Acquired: 08/05/2021 Weeks Of Treatment: 11 Clustered Wound: No Photos Wound Measurements Length: (cm) 0.4 Width: (cm) 0.3 Depth: (cm) 0.1 Area: (cm) 0.094 Volume: (cm) 0.009 % Reduction Robert [name] Area: 99.9% % Reduction Robert [name] Volume: 99.9% Epithelialization: None Wound Description Classification: Full Thickness Without Exposed Support Structures Exudate Amount: Medium Exudate Type: Sanguinous Exudate Color: red Foul Odor After Cleansing: No Slough/Fibrino No Wound Bed Granulation Amount:  Large (67-100%) Exposed Structure Granulation Quality: Hyper-granulation, Friable Fascia Exposed: No Necrotic Amount: None Present (0%) Fat Layer (Subcutaneous Tissue) Exposed: Yes Tendon Exposed: No Muscle Exposed: No Joint Exposed: No Bone Exposed: No Treatment Notes Wound #3 (Upper Leg) Wound Laterality: Left Cleanser Wound Cleanser Discharge Instruction: Wash your hands with soap and water. Remove old dressing, discard into plastic bag and place into trash. Cleanse the wound with Wound Cleanser prior to applying a clean dressing using gauze sponges, not tissues or cotton balls. Do not scrub or use excessive force. Pat dry using gauze sponges, not tissue or cotton balls. Peri-Wound Care Topical Triamcinolone Acetonide Cream, 0.1%, 15 (g) tube Discharge Instruction: Apply as directed by Rosalie Buenaventura. Nystatin Cream, 30 (g) tube Discharge Instruction: Robert [name] office spray at home Primary Dressing Secondary Dressing ABD Pad 5x9 (Robert [name]/Robert [name]) Discharge Instruction: Cover with ABD pad Secured With Medipore T - 41M Medipore H Soft Cloth Surgical T ape ape, 2x2 (Robert [name]/yd) [place], [name] ([id number]) [id number] Page 9 of 13 Compression Wrap Compression Stockings Add-Ons Electronic Signature(s) Signed: 01/17/2022 12:25:01 PM By: Yevonne Pax RN Signed: 01/23/2022 11:24:03 AM By: Betha Loa Entered By: Betha Loa on 01/15/2022 14:48:48 -------------------------------------------------------------------------------- Wound Assessment Details Patient Name: Date of Service: Robert [name], [name] 01/15/2022 2:15 PM Medical Record Number: [id number] Patient Account Number: [id number] Date of Birth/Sex: Treating RN: 1985-04-25 (35 y.o. Melonie [place] Primary Care Allyson Tineo: Unknown Foley Other Clinician: Betha Loa Referring Margarete Horace: Treating Trachelle Low/Extender: Penni Homans Weeks Robert [name] Treatment: 11 Wound Status Wound Number: 4 Primary Etiology:  Atypical Wound Location: Left, Circumferential Lower Leg Wound Status: Open Wounding Event: Gradually Appeared Comorbid History: Quadriplegia Date Acquired: 08/05/2021 Weeks Of Treatment: 11 Clustered Wound: No Photos Wound Measurements Length: (cm) 5 Width: (cm) 5.5 Depth: (cm) 0.1 Area: (cm) 21.598  Volume: (cm) 2.16 % Reduction Robert [name] Area: 95.8% % Reduction Robert [name] Volume: 95.8% Epithelialization: None Wound Description Classification: Full Thickness Without Exposed Support Structures Exudate Amount: Medium Exudate Type: Sanguinous Exudate Color: red Foul Odor After Cleansing: No Slough/Fibrino No Wound Bed Granulation Amount: Large (67-100%) Exposed Structure Granulation Quality: Red, Hyper-granulation, Friable Fascia Exposed: No Fat Layer (Subcutaneous Tissue) Exposed: Yes Tendon Exposed: No Muscle Exposed: No Iwanicki, Dhani ([id number]) [id number] Page 10 of 13 Joint Exposed: No Bone Exposed: No Treatment Notes Wound #4 (Lower Leg) Wound Laterality: Left, Circumferential Cleanser Wound Cleanser Discharge Instruction: Wash your hands with soap and water. Remove old dressing, discard into plastic bag and place into trash. Cleanse the wound with Wound Cleanser prior to applying a clean dressing using gauze sponges, not tissues or cotton balls. Do not scrub or use excessive force. Pat dry using gauze sponges, not tissue or cotton balls. Peri-Wound Care Topical Triamcinolone Acetonide Cream, 0.1%, 15 (g) tube Discharge Instruction: Apply as directed by Sumiko Ceasar. Nystatin Cream, 30 (g) tube Discharge Instruction: Robert [name] office spray at home Primary Dressing Secondary Dressing Kerlix 4.5 x 4.1 (Robert [name]/yd) Discharge Instruction: Apply Kerlix 4.5 x 4.1 (Robert [name]/yd) as instructed Secured With Medipore T - 56M Medipore H Soft Cloth Surgical T ape ape, 2x2 (Robert [name]/yd) Compression Wrap Compression Stockings Add-Ons Electronic Signature(s) Signed: 01/17/2022 12:25:01  PM By: Yevonne Pax RN Signed: 01/23/2022 11:24:03 AM By: Betha Loa Entered By: Betha Loa on 01/15/2022 14:49:27 -------------------------------------------------------------------------------- Wound Assessment Details Patient Name: Date of Service: Robert [name], [name] 01/15/2022 2:15 PM Medical Record Number: [id number] Patient Account Number: [id number] Date of Birth/Sex: Treating RN: 1985-06-26 (35 y.o. Melonie [place] Primary Care Kamilla Hands: Unknown Foley Other Clinician: Betha Loa Referring Jolayne Branson: Treating Brayten Komar/Extender: Penni Homans Weeks Robert [name] Treatment: 11 Wound Status Wound Number: 6 Primary Etiology: Atypical Wound Location: Back Wound Status: Open Wounding Event: Gradually Appeared Comorbid History: Quadriplegia Date Acquired: 07/06/2021 Weeks Of Treatment: 11 Clustered Wound: No Photos NOX, TALENT ([id number]) [id number] Page 11 of 13 Wound Measurements Length: (cm) 30 Width: (cm) 26.5 Depth: (cm) 0.1 Area: (cm) 624.392 Volume: (cm) 62.439 % Reduction Robert [name] Area: 19.7% % Reduction Robert [name] Volume: 19.7% Epithelialization: None Wound Description Classification: Full Thickness Without Exposed Support Structures Exudate Amount: Medium Exudate Type: Sanguinous Exudate Color: red Foul Odor After Cleansing: No Slough/Fibrino No Wound Bed Granulation Amount: Large (67-100%) Exposed Structure Granulation Quality: Red, Hyper-granulation, Friable Fascia Exposed: No Necrotic Amount: None Present (0%) Fat Layer (Subcutaneous Tissue) Exposed: Yes Tendon Exposed: No Muscle Exposed: No Joint Exposed: No Bone Exposed: No Treatment Notes Wound #6 (Back) Cleanser Wound Cleanser Discharge Instruction: Wash your hands with soap and water. Remove old dressing, discard into plastic bag and place into trash. Cleanse the wound with Wound Cleanser prior to applying a clean dressing using gauze sponges, not tissues or  cotton balls. Do not scrub or use excessive force. Pat dry using gauze sponges, not tissue or cotton balls. Peri-Wound Care Topical Triamcinolone Acetonide Cream, 0.1%, 15 (g) tube Discharge Instruction: Apply as directed by Derya Dettmann. Nystatin Cream, 30 (g) tube Discharge Instruction: Robert [name] office spray at home Primary Dressing Secondary Dressing Gauze Discharge Instruction: As directed: dry, moistened with saline or moistened with Dakins Solution Secured With Medipore T - 56M Medipore H Soft Cloth Surgical T ape ape, 2x2 (Robert [name]/yd) Compression Wrap Compression Stockings Add-Ons Electronic Signature(s) Signed: 01/17/2022 12:25:01 PM By: Yevonne Pax RN Signed: 01/23/2022 11:24:03 AM By: Betha Loa Entered By: Betha Loa on 01/15/2022 14:50:00 [name] ([id number]) [id number] Page 12 of 13 --------------------------------------------------------------------------------  Wound Assessment Details Patient Name: Date of Service: Robert [name] [name], Robert [name] [name] 01/15/2022 2:15 PM Medical Record Number: [id number] Patient Account Number: [id number] Date of Birth/Sex: Treating RN: [birth year] (35 y.o. Judie PetitM) Yevonne PaxEpps, Carrie Primary Care Mitcheal Sweetin: Unknown FoleyWhitten, Robin Other Clinician: Betha LoaVenable, Angie Referring Lanisha Stepanian: Treating Patric Buckhalter/Extender: Penni HomansHoffman, Jessica Whitten, Robin Weeks Robert [name] Treatment: 11 Wound Status Wound Number: 7 Primary Etiology: Skin Tear Wound Location: Right, Anterior Lower Leg Wound Status: Healed - Epithelialized Wounding Event: Skin Tear/Laceration Comorbid History: Quadriplegia Date Acquired: 12/18/2021 Weeks Of Treatment: 4 Clustered Wound: No Photos Wound Measurements Length: (cm) Width: (cm) Depth: (cm) Area: (cm) Volume: (cm) 0 % Reduction Robert [name] Area: 100% 0 % Reduction Robert [name] Volume: 100% 0 Epithelialization: None 0 0 Wound Description Classification: Full Thickness Without Exposed Support Wound Margin: Flat and Intact Exudate Amount:  Medium Exudate Type: Serosanguineous Exudate Color: red, brown Structures Foul Odor After Cleansing: No Slough/Fibrino No Wound Bed Granulation Amount: Small (1-33%) Exposed Structure Granulation Quality: Red Fascia Exposed: No Necrotic Amount: None Present (0%) Fat Layer (Subcutaneous Tissue) Exposed: Yes Tendon Exposed: No Muscle Exposed: No Joint Exposed: No Bone Exposed: No Treatment Notes Wound #7 (Lower Leg) Wound Laterality: Right, Anterior Cleanser Peri-Wound Care Franki MonteDEESE, Thereasa DistanceODNEY ([id number]) [id number] Page 13 of 13 Topical Primary Dressing Secondary Dressing Secured With Compression Wrap Compression Stockings Add-Ons Electronic Signature(s) Signed: 01/17/2022 12:25:01 PM By: Yevonne PaxEpps, Carrie RN Signed: 01/23/2022 11:24:03 AM By: Betha LoaVenable, Angie Entered By: Betha LoaVenable, Angie on 01/15/2022 15:01:25 -------------------------------------------------------------------------------- Vitals Details Patient Name: Date of Service: Robert [name] [name], Robert [name] [name] 01/15/2022 2:15 PM Medical Record Number: [id number] Patient Account Number: [id number] Date of Birth/Sex: Treating RN: [birth year] (35 y.o. Melonie FloridaM) Epps, Carrie Primary Care Abdulrahman Bracey: Unknown FoleyWhitten, Robin Other Clinician: Betha LoaVenable, Angie Referring Niyonna Betsill: Treating Syvanna Ciolino/Extender: Howard PouchHoffman, Jessica Whitten, Robin Weeks Robert [name] Treatment: 11 Vital Signs Time Taken: 14:34 Temperature (F): 97.9 Height (Robert [name]): 73 Pulse (bpm): 78 Weight (lbs): 185 Respiratory Rate (breaths/min): 16 Body Mass Index (BMI): 24.4 Blood Pressure (mmHg): 114/81 Reference Range: 80 - 120 mg / dl Electronic Signature(s) Signed: 01/23/2022 11:24:03 AM By: Betha LoaVenable, Angie Entered By: Betha LoaVenable, Angie on 01/15/2022 14:36:47

## 2022-02-26 ENCOUNTER — Encounter: Payer: Medicare Other | Attending: Internal Medicine | Admitting: Internal Medicine

## 2022-02-26 DIAGNOSIS — L988 Other specified disorders of the skin and subcutaneous tissue: Secondary | ICD-10-CM | POA: Diagnosis not present

## 2022-02-26 DIAGNOSIS — G8250 Quadriplegia, unspecified: Secondary | ICD-10-CM | POA: Diagnosis not present

## 2022-02-26 DIAGNOSIS — S81811A Laceration without foreign body, right lower leg, initial encounter: Secondary | ICD-10-CM | POA: Insufficient documentation

## 2022-02-26 DIAGNOSIS — L98422 Non-pressure chronic ulcer of back with fat layer exposed: Secondary | ICD-10-CM | POA: Diagnosis present

## 2022-02-26 DIAGNOSIS — L97829 Non-pressure chronic ulcer of other part of left lower leg with unspecified severity: Secondary | ICD-10-CM

## 2022-02-26 DIAGNOSIS — B354 Tinea corporis: Secondary | ICD-10-CM | POA: Insufficient documentation

## 2022-02-26 DIAGNOSIS — G8222 Paraplegia, incomplete: Secondary | ICD-10-CM | POA: Diagnosis not present
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
## 2022-02-27 NOTE — Progress Notes (Addendum)
[name] ([id number]) [id number] Page 1 of 12 Visit Report for 02/26/2022 Arrival Information Details Patient Name: Date of Service: [name], [name] [facility] 02/26/2022 2:15 PM Medical Record Number: [id number] Patient Account Number: [id number] Date of Birth/Sex: Treating RN: 07-17-1985 (36 y.o. [name] Primary Care Sarkis Rhines: Unknown Foley Other Clinician: Referring Kayne Yuhas: Treating Lua Feng/Extender: Howard Pouch in Treatment: 17 Visit Information History Since Last Visit Added or deleted any medications: No Patient Arrived: Wheel Chair Any new allergies or adverse reactions: No Arrival Time: 14:15 Signs or symptoms of abuse/neglect since last visito No Accompanied By: sister Hospitalized since last visit: No Transfer Assistance: Hoyer Lift Pain Present Now: No Patient Requires Transmission-Based Precautions: No Patient Has Alerts: No Electronic Signature(s) Signed: 02/26/2022 4:52:27 PM By: Midge Aver MSN RN CNS WTA Entered By: Midge Aver on 02/26/2022 14:22:38 -------------------------------------------------------------------------------- Clinic Level of Care Assessment Details Patient Name: Date of Service: [name], [name] [facility] 02/26/2022 2:15 PM Medical Record Number: [id number] Patient Account Number: [id number] Date of Birth/Sex: Treating RN: 1985-12-02 (36 y.o. [name] Primary Care Oaklynn Stierwalt: Unknown Foley Other Clinician: Referring Breena Bevacqua: Treating Laysa Kimmey/Extender: Howard Pouch in Treatment: 17 Clinic Level of Care Assessment Items TOOL 4 Quantity Score X- 1 0 Use when only an EandM is performed on FOLLOW-UP visit ASSESSMENTS - Nursing Assessment / Reassessment X- 1 10 Reassessment of Co-morbidities (includes updates in patient status) X- 1 5 Reassessment of Adherence to Treatment Plan ASSESSMENTS - Wound and Skin A ssessment / Reassessment X - Simple Wound Assessment  / Reassessment - one wound 1 5 []  - 0 Complex Wound Assessment / Reassessment - multiple wounds []  - 0 Dermatologic / Skin Assessment (not related to wound area) [name] ( ) [id number] Page 2 of 12 ASSESSMENTS - Focused Assessment []  - 0 Circumferential Edema Measurements - multi extremities []  - 0 Nutritional Assessment / Counseling / Intervention []  - 0 Lower Extremity Assessment (monofilament, tuning fork, pulses) []  - 0 Peripheral Arterial Disease Assessment (using hand held doppler) ASSESSMENTS - Ostomy and/or Continence Assessment and Care []  - 0 Incontinence Assessment and Management []  - 0 Ostomy Care Assessment and Management (repouching, etc.) PROCESS - Coordination of Care X - Simple Patient / Family Education for ongoing care 1 15 []  - 0 Complex (extensive) Patient / Family Education for ongoing care X- 1 10 Staff obtains [id number], Records, T Results / Process Orders est []  - 0 Staff telephones HHA, Nursing Homes / Clarify orders / etc []  - 0 Routine Transfer to another Facility (non-emergent condition) []  - 0 Routine Hospital Admission (non-emergent condition) []  - 0 New Admissions / 05-19-1997 / Ordering NPWT Apligraf, etc. , []  - 0 Emergency Hospital Admission (emergent condition) X- 1 10 Simple Discharge Coordination []  - 0 Complex (extensive) Discharge Coordination PROCESS - Special Needs []  - 0 Pediatric / Minor Patient Management []  - 0 Isolation Patient Management []  - 0 Hearing / Language / Visual special needs []  - 0 Assessment of Community assistance (transportation, D/C planning, etc.) []  - 0 Additional assistance / Altered mentation []  - 0 Support Surface(s) Assessment (bed, cushion, seat, etc.) INTERVENTIONS - Wound Cleansing / Measurement X - Simple Wound Cleansing - one wound 1 5 []  - 0 Complex Wound Cleansing - multiple wounds X- 1 5 Wound Imaging (photographs - any number  of wounds) []  - 0 Wound Tracing (instead of photographs) X- 1 5 Simple Wound Measurement - one wound []  - 0 Complex Wound Measurement - multiple wounds INTERVENTIONS -  Wound Dressings X - Small Wound Dressing one or multiple wounds 1 10 []  - 0 Medium Wound Dressing one or multiple wounds []  - 0 Large Wound Dressing one or multiple wounds []  - 0 Application of Medications - topical []  - 0 Application of Medications - injection INTERVENTIONS - Miscellaneous []  - 0 External ear exam []  - 0 Specimen Collection (cultures, biopsies, blood, body fluids, etc.) []  - 0 Specimen(s) / Culture(s) sent or taken to Lab for analysis []  - 0 Patient Transfer (multiple staff / Michiel SitesHoyer Lift / Similar devices) []  - 0 Simple Staple / Suture removal (25 or less) [name] ([id number]) [id number] Page 3 of 12 []  - 0 Complex Staple / Suture removal (26 or more) []  - 0 Hypo / Hyperglycemic Management (close monitor of Blood Glucose) []  - 0 Ankle / Brachial Index (ABI) - do not check if billed separately X- 1 5 Vital Signs Has the patient been seen at the hospital within the last three years: Yes Total Score: 85 Level Of Care: New/Established - Level 3 Electronic Signature(s) Signed: 02/26/2022 4:52:27 PM By: Midge AverSmith, Vicki MSN RN CNS WTA Entered By: Midge AverSmith, Vicki on 02/26/2022 14:45:06 -------------------------------------------------------------------------------- Complex / Palliative Patient Assessment Details Patient Name: Date of Service: [name], [name] 02/26/2022 2:15 PM Medical Record Number: [id number] Patient Account Number: [id number] Date of Birth/Sex: Treating RN: Nov 21, 1985 (36 y.o. [name]) [name] Primary Care [name]: Unknown FoleyWhitten, Robin Other Clinician: Referring [name]: Treating [name]: Robert HomansHoffman, Jessica Whitten, Robin [name] in Treatment: 17 Complex Wound Management Criteria Patient has remarkable or complex co-morbidities requiring  medications or treatments that extend wound healing times. Examples: Diabetes mellitus with chronic renal failure or end stage renal disease requiring dialysis Advanced or poorly controlled rheumatoid arthritis Diabetes mellitus and end stage chronic obstructive pulmonary disease Active cancer with current chemo- or radiation therapy Paraplegia Palliative Wound Management Criteria Care Approach Wound Care Plan: Complex Wound Management Electronic Signature(s) Signed: 04/02/2022 9:16:18 AM By: Elliot GurneyWoody, BSN, RN, CWS, Kim RN, BSN Signed: 04/22/2022 12:50:44 PM By: Geralyn CorwinHoffman, Jessica DO Entered By: Elliot GurneyWoody, BSN, RN, CWS, [name] on 04/02/2022 09:16:18 -------------------------------------------------------------------------------- Encounter Discharge Information Details Patient Name: Date of Service: [name], [name] 02/26/2022 2:15 PM Medical Record Number: [id number] Patient Account Number: [id number] Date of Birth/Sex: Treating RN: Nov 21, 1985 (36 y.o. Vella RaringM) Smith, Vicki Vilchis, Thereasa DistanceODNEY ([id number]) [id number] Page 4 of 12 Primary Care Vartan Kerins: Unknown FoleyWhitten, Robin Other Clinician: Referring Shae Hinnenkamp: Treating Ida Milbrath/Extender: Howard PouchHoffman, Jessica Whitten, Robin [name] in Treatment: 17 Encounter Discharge Information Items Discharge Condition: Stable Ambulatory Status: Wheelchair Discharge Destination: Home Transportation: Private Auto Accompanied By: sister Schedule Follow-up Appointment: Yes Clinical Summary of Care: Electronic Signature(s) Signed: 02/26/2022 4:52:27 PM By: Midge AverSmith, Vicki MSN RN CNS WTA Entered By: Midge AverSmith, Vicki on 02/26/2022 14:46:10 -------------------------------------------------------------------------------- Lower Extremity Assessment Details Patient Name: Date of Service: [name], [name] 02/26/2022 2:15 PM Medical Record Number: [id number] Patient Account Number: [id number] Date of Birth/Sex: Treating RN: Nov 21, 1985 (36 y.o. Robert CluckM) Smith, Vicki Primary Care  Titania Gault: Unknown FoleyWhitten, Robin Other Clinician: Referring Jamelle Noy: Treating Machell Wirthlin/Extender: Robert HomansHoffman, Jessica Whitten, Robin [name] in Treatment: 17 Electronic Signature(s) Signed: 02/26/2022 4:52:27 PM By: Midge AverSmith, Vicki MSN RN CNS WTA Entered By: Midge AverSmith, Vicki on 02/26/2022 14:36:20 -------------------------------------------------------------------------------- Multi Wound Chart Details Patient Name: Date of [facility]: [name]26[facility] 2:15 PM Medical Record Number: [id number] Patient Account Number: [id number] Date of Birth/Sex: Treating RN: Nov 21, 1985 (36 y.o. Robert CluckM) Smith, Vicki Primary Care Bedie Dominey: Unknown FoleyWhitten, Robin Other Clinician: Referring Lavi Sheehan: Treating Starlyn Droge/Extender: Robert HomansHoffman, Jessica Whitten, Robin [name] in Treatment: 17 Vital Signs Height(in):  73 Pulse(bpm): 62 Weight(lbs): 185 Blood Pressure(mmHg): 124/87 Body Mass Index(BMI): 24.4 Temperature(F): 98.1 Respiratory Rate(breaths/min): 16 Hairfield, [name] ([id number]) [id number] Page 5 of 12 [3:Photos:] Left Upper Leg Left, Circumferential Lower Leg Back Wound Location: Other Lesion Gradually Appeared Gradually Appeared Wounding Event: Atypical Atypical Atypical Primary Etiology: Quadriplegia Quadriplegia Quadriplegia Comorbid History: 08/05/2021 08/05/2021 07/06/2021 Date Acquired: 17 17 17  [name] of Treatment: Healed - Epithelialized Healed - Epithelialized Open Wound Status: No No No Wound Recurrence: 0x0x0 0x0x0 35x33x0.1 Measurements L x W x D (cm) 0 0 907.135 A (cm) : rea 0 0 90.713 Volume (cm) : 100.00% 100.00% -16.70% % Reduction in A rea: 100.00% 100.00% -16.70% % Reduction in Volume: Full Thickness Without Exposed Full Thickness Without Exposed Full Thickness Without Exposed Classification: Support Structures Support Structures Support Structures Medium Medium Medium Exudate Amount: Sanguinous Sanguinous Sanguinous Exudate Type: red red red Exudate  Color: Large (67-100%) Large (67-100%) Large (67-100%) Granulation Amount: Hyper-granulation, Friable Red, Hyper-granulation, Friable Red, Hyper-granulation, Friable Granulation Quality: None Present (0%) N/A None Present (0%) Necrotic Amount: Fat Layer (Subcutaneous Tissue): Yes Fat Layer (Subcutaneous Tissue): Yes Fat Layer (Subcutaneous Tissue): Yes Exposed Structures: Fascia: No Fascia: No Fascia: No Tendon: No Tendon: No Tendon: No Muscle: No Muscle: No Muscle: No Joint: No Joint: No Joint: No Bone: No Bone: No Bone: No None None None Epithelialization: Treatment Notes Electronic Signature(s) Signed: 02/26/2022 4:52:27 PM By: 02/28/2022 MSN RN CNS WTA Entered By: Midge Aver on 02/26/2022 14:37:30 -------------------------------------------------------------------------------- Multi-Disciplinary Care Plan Details Patient Name: Date of Service: [name], [name] [facility] 02/26/2022 2:15 PM Medical Record Number: 02/28/2022 Patient Account Number: [id number] Date of Birth/Sex: Treating RN: 1985-06-27 (36 y.o. 02/24/1986 Primary Care Darcie Mellone: [name] Other Clinician: Referring Noemi Ishmael: Treating Mabel Roll/Extender: Unknown Foley [name] in Treatment: 17 Active Inactive Wound/Skin Impairment Nursing Diagnoses: Knowledge deficit related to ulceration/compromised skin integrity EGGER, Franki Monte ([name]) [id number] Page 6 of 12 Goals: Patient/caregiver will verbalize understanding of skin care regimen Date Initiated: 10/30/2021 Target Resolution Date: 11/30/2021 Goal Status: Active Ulcer/skin breakdown will have a volume reduction of 30% by week 4 Date Initiated: 10/30/2021 Target Resolution Date: 11/30/2021 Goal Status: Active Ulcer/skin breakdown will have a volume reduction of 50% by week 8 Date Initiated: 10/30/2021 Target Resolution Date: 12/31/2021 Goal Status: Active Ulcer/skin breakdown will have a volume  reduction of 80% by week 12 Date Initiated: 10/30/2021 Target Resolution Date: 01/14/2022 Goal Status: Active Ulcer/skin breakdown will heal within 14 [name] Date Initiated: 10/30/2021 Target Resolution Date: 03/02/2022 Goal Status: Active Interventions: Assess patient/caregiver ability to obtain necessary supplies Assess patient/caregiver ability to perform ulcer/skin care regimen upon admission and as needed Assess ulceration(s) every visit Notes: Electronic Signature(s) Signed: 02/26/2022 4:52:27 PM By: 02/28/2022 MSN RN CNS WTA Entered By: Midge Aver on 02/26/2022 14:36:25 -------------------------------------------------------------------------------- Pain Assessment Details Patient Name: Date of Service: [name], [name] 02/26/2022 2:15 PM Medical Record Number: 02/28/2022 Patient Account Number: [id number] Date of Birth/Sex: Treating RN: [birth year] (36 y.o. 31 Primary Care Amry Cathy: [name] Other Clinician: Referring Lulu Hirschmann: Treating Wanda Rideout/Extender: Unknown Foley [name] in Treatment: 17 Active Problems Location of Pain Severity and Description of Pain Patient Has Paino No Site Locations Pain Management and Medication DACIAN, [place] ([name]) [id number] Page 7 of 12 Current Pain Management: Electronic Signature(s) Signed: 02/26/2022 4:52:27 PM By: 02/28/2022 MSN RN CNS WTA Entered By: Midge Aver on 02/26/2022 14:23:12 -------------------------------------------------------------------------------- Patient/Caregiver Education Details Patient Name: Date of Service: RYLEIGH, BUENGER [facility] 11/22/2023andnbsp2:15 PM Medical Record Number: 02/28/2022 Patient Account  Number: [id number] Date of Birth/Gender: Treating RN: 1985/05/20 (36 y.o. [name] Primary Care Physician: Unknown Foley Other Clinician: Referring Physician: Treating Physician/Extender: Howard Pouch in  Treatment: 17 Education Assessment Education Provided To: Patient and Caregiver Education Topics Provided Wound/Skin Impairment: Handouts: Caring for Your Ulcer Methods: Explain/Verbal Responses: State content correctly Electronic Signature(s) Signed: 02/26/2022 4:52:27 PM By: Midge Aver MSN RN CNS WTA Entered By: Midge Aver on 02/26/2022 14:45:26 -------------------------------------------------------------------------------- Wound Assessment Details Patient Name: Date of Service: [name], [name] 02/26/2022 2:15 PM Medical Record Number: [id number] Patient Account Number: [id number] Date of Birth/Sex: Treating RN: 1986/01/17 (36 y.o. [name] Primary Care Fain Francis: Unknown Foley Other Clinician: Referring Erin Uecker: Treating Carmon Sahli/Extender: [name] [name] in Treatment: 17 Wound Status Wound Number: 3 Primary Etiology: Atypical Wound Location: Left Upper Leg Wound Status: Healed Kabeer Hoagland, Herb ([id number]) [id number] Page 8 of 12 Wounding Event: Other Lesion Comorbid History: Quadriplegia Date Acquired: 08/05/2021 [name] Of Treatment: 17 Clustered Wound: No Photos Wound Measurements Length: (cm) Width: (cm) Depth: (cm) Area: (cm) Volume: (cm) 0 % Reduction in Area: 100% 0 % Reduction in Volume: 100% 0 Epithelialization: None 0 0 Wound Description Classification: Full Thickness Without Exposed Support Exudate Amount: Medium Exudate Type: Sanguinous Exudate Color: red Structures Foul Odor After Cleansing: No Slough/Fibrino No Wound Bed Granulation Amount: Large (67-100%) Exposed Structure Granulation Quality: Hyper-granulation, Friable Fascia Exposed: No Necrotic Amount: None Present (0%) Fat Layer (Subcutaneous Tissue) Exposed: Yes Tendon Exposed: No Muscle Exposed: No Joint Exposed: No Bone Exposed: No Treatment Notes Wound #3 (Upper Leg) Wound Laterality:  Left Cleanser Peri-Wound Care Topical Primary Dressing Secondary Dressing Secured With Compression Wrap Compression Stockings Add-Ons Electronic Signature(s) Signed: 02/26/2022 4:52:27 PM By: Midge Aver MSN RN CNS WTA Entered By: Midge Aver on 02/26/2022 14:36:09 Detweiler, [name] ([id number]) [id number].pdf Page 9 of 12 -------------------------------------------------------------------------------- Wound Assessment Details Patient Name: Date of Service: [name], [name] [facility] 02/26/2022 2:15 PM Medical Record Number: [id number] Patient Account Number: [id number] Date of Birth/Sex: Treating RN: [birth year] (36 y.o. [name] Primary Care Seleni Meller: Unknown Foley Other Clinician: Referring Jared Cahn: Treating Dineen Conradt/Extender: [name] [name] in Treatment: 17 Wound Status Wound Number: 4 Primary Etiology: Atypical Wound Location: Left, Circumferential Lower Leg Wound Status: Healed - Epithelialized Wounding Event: Gradually Appeared Comorbid History: Quadriplegia Date Acquired: 08/05/2021 [name] Of Treatment: 17 Clustered Wound: No Photos Wound Measurements Length: (cm) Width: (cm) Depth: (cm) Area: (cm) Volume: (cm) 0 % Reduction in Area: 100% 0 % Reduction in Volume: 100% 0 Epithelialization: None 0 0 Wound Description Classification: Full Thickness Without Exposed Support Exudate Amount: Medium Exudate Type: Sanguinous Exudate Color: red Structures Foul Odor After Cleansing: No Slough/Fibrino No Wound Bed Granulation Amount: Large (67-100%) Exposed Structure Granulation Quality: Red, Hyper-granulation, Friable Fascia Exposed: No Fat Layer (Subcutaneous Tissue) Exposed: Yes Tendon Exposed: No Muscle Exposed: No Joint Exposed: No Bone Exposed: No Treatment Notes Wound #4 (Lower Leg) Wound Laterality: Left, Circumferential Cleanser Peri-Wound Care Topical Schul, Maxson ([id number])  [id number] Page 10 of 12 Primary Dressing Secondary Dressing Secured With Compression Wrap Compression Stockings Add-Ons Electronic Signature(s) Signed: 02/26/2022 4:52:27 PM By: Midge Aver MSN RN CNS WTA Entered By: Midge Aver on 02/26/2022 14:36:15 -------------------------------------------------------------------------------- Wound Assessment Details Patient Name: Date of Service: [name], [name] 02/26/2022 2:15 PM Medical Record Number: [id number] Patient Account Number: [id number] Date of Birth/Sex: Treating RN: 03/27/1986 (36 y.o. [name] Primary Care Toshiro Hanken: Unknown Foley Other Clinician: Referring Laurelle Skiver: Treating Kharee Lesesne/Extender: [name]  [name] in Treatment: 17 Wound Status Wound Number: 6 Primary Etiology: Atypical Wound Location: Back Wound Status: Open Wounding Event: Gradually Appeared Comorbid History: Quadriplegia Date Acquired: 07/06/2021 [name] Of Treatment: 17 Clustered Wound: No Photos Wound Measurements Length: (cm) 35 Width: (cm) 33 Depth: (cm) 0.1 Area: (cm) 907.135 Volume: (cm) 90.713 % Reduction in Area: -16.7% % Reduction in Volume: -16.7% Epithelialization: None Wound Description Classification: Full Thickness Without Exposed Support Structures Exudate Amount: Medium Exudate Type: Sanguinous Exudate Color: red Foul Odor After Cleansing: No Slough/Fibrino No Wound Bed Granulation Amount: Large (67-100%) Exposed Structure Kibler, Lakyn ([id number]) [id number] Page 11 of 12 Granulation Quality: Red, Hyper-granulation, Friable Fascia Exposed: No Necrotic Amount: None Present (0%) Fat Layer (Subcutaneous Tissue) Exposed: Yes Tendon Exposed: No Muscle Exposed: No Joint Exposed: No Bone Exposed: No Treatment Notes Wound #6 (Back) Cleanser Wound Cleanser Discharge Instruction: Wash your hands with soap and water. Remove old dressing, discard  into plastic bag and place into trash. Cleanse the wound with Wound Cleanser prior to applying a clean dressing using gauze sponges, not tissues or cotton balls. Do not scrub or use excessive force. Pat dry using gauze sponges, not tissue or cotton balls. Peri-Wound Care Topical Triamcinolone Acetonide Cream, 0.1%, 15 (g) tube Discharge Instruction: Apply as directed by Jenee Spaugh. Nystatin Cream, 30 (g) tube Discharge Instruction: in office spray at home Primary Dressing Secondary Dressing Gauze Discharge Instruction: As directed: dry, moistened with saline or moistened with Dakins Solution Secured With Medipore T - 44M Medipore H Soft Cloth Surgical T ape ape, 2x2 (in/yd) Compression Wrap Compression Stockings Add-Ons Electronic Signature(s) Signed: 02/26/2022 4:52:27 PM By: Midge Aver MSN RN CNS WTA Entered By: Midge Aver on 02/26/2022 14:31:44 -------------------------------------------------------------------------------- Vitals Details Patient Name: Date of Service: GABRIELA, GIANNELLI 02/26/2022 2:15 PM Medical Record Number: [id number] Patient Account Number: [id number] Date of Birth/Sex: Treating RN: November 11, 1985 (36 y.o. [name] Primary Care Lacole Komorowski: Unknown Foley Other Clinician: Referring Belita Warsame: Treating Zacaria Pousson/Extender: [name] [name] in Treatment: 17 Vital Signs Time Taken: 14:22 Temperature (F): 98.1 Height (in): 73 Pulse (bpm): 62 Weight (lbs): 185 Respiratory Rate (breaths/min): 16 Body Mass Index (BMI): 24.4 Blood Pressure (mmHg): 124/87 Reference Range: 80 - 120 mg / dl Electronic Signature(s) Kundrat, Tamir ([id number]) [id number] Page 12 of 12 Signed: 02/26/2022 4:52:27 PM By: Midge Aver MSN RN CNS WTA Entered By: Midge Aver on 02/26/2022 14:23:07

## 2022-05-07 ENCOUNTER — Ambulatory Visit: Payer: Medicare Other | Admitting: Internal Medicine

## 2022-05-08 ENCOUNTER — Ambulatory Visit (INDEPENDENT_AMBULATORY_CARE_PROVIDER_SITE_OTHER): Payer: 59 | Admitting: Dermatology

## 2022-05-08 DIAGNOSIS — I872 Venous insufficiency (chronic) (peripheral): Secondary | ICD-10-CM

## 2022-05-08 DIAGNOSIS — Z87828 Personal history of other (healed) physical injury and trauma: Secondary | ICD-10-CM | POA: Diagnosis not present

## 2022-05-08 DIAGNOSIS — L909 Atrophic disorder of skin, unspecified: Secondary | ICD-10-CM

## 2022-05-08 DIAGNOSIS — R21 Rash and other nonspecific skin eruption: Secondary | ICD-10-CM | POA: Diagnosis not present

## 2022-05-08 MED ORDER — TACROLIMUS 0.1 % EX OINT: TOPICAL_OINTMENT | Freq: Two times a day (BID) | CUTANEOUS | 2 refills | Status: DC

## 2022-05-08 NOTE — Patient Instructions (Signed)
Due to recent changes in healthcare laws, you may see results of your pathology and/or laboratory studies on MyChart before the doctors have had a chance to review them. We understand that in some cases there may be results that are confusing or concerning to you. Please understand that not all results are received at the same time and often the doctors may need to interpret multiple results in order to provide you with the best plan of care or course of treatment. Therefore, we ask that you please give us 2 business days to thoroughly review all your results before contacting the office for clarification. Should we see a critical lab result, you will be contacted sooner.   If You Need Anything After Your Visit  If you have any questions or concerns for your doctor, please call our main line at [phone] and press option 4 to reach your doctor's medical assistant. If no one answers, please leave a voicemail as directed and we will return your call as soon as possible. Messages left after 4 pm will be answered the following business day.   You may also send us a message via MyChart. We typically respond to MyChart messages within 1-2 business days.  For prescription refills, please ask your pharmacy to contact our office. Our fax number is [phone].  If you have an urgent issue when the clinic is closed that cannot wait until the next business day, you can page your doctor at the number below.    Please note that while we do our best to be available for urgent issues outside of office hours, we are not available 24/7.   If you have an urgent issue and are unable to reach us, you may choose to seek medical care at your doctor's office, retail clinic, urgent care center, or emergency room.  If you have a medical emergency, please immediately call 911 or go to the emergency department.  Pager Numbers  - Dr. Kowalski: [phone]  - Dr. Moye: [phone]  - Dr. Stewart:  [phone]  In the event of inclement weather, please call our main line at [phone] for an update on the status of any delays or closures.  Dermatology Medication Tips: Please keep the boxes that topical medications come in in order to help keep track of the instructions about where and how to use these. Pharmacies typically print the medication instructions only on the boxes and not directly on the medication tubes.   If your medication is too expensive, please contact our office at [phone] option 4 or send us a message through MyChart.   We are unable to tell what your co-pay for medications will be in advance as this is different depending on your insurance coverage. However, we may be able to find a substitute medication at lower cost or fill out paperwork to get insurance to cover a needed medication.   If a prior authorization is required to get your medication covered by your insurance company, please allow us 1-2 business days to complete this process.  Drug prices often vary depending on where the prescription is filled and some pharmacies may offer cheaper prices.  The website www.goodrx.com contains coupons for medications through different pharmacies. The prices here do not account for what the cost may be with help from insurance (it may be cheaper with your insurance), but the website can give you the price if you did not use any insurance.  - You can print the associated coupon and take it with   your prescription to the pharmacy.  - You may also stop by our office during regular business hours and pick up a GoodRx coupon card.  - If you need your prescription sent electronically to a different pharmacy, notify our office through [facility] MyChart or by phone at [phone] option 4.     Si Usted Necesita Algo Despus de Su Visita  Tambin puede enviarnos un mensaje a travs de MyChart. Por lo general respondemos a los mensajes de MyChart en el transcurso de 1 a 2  das hbiles.  Para renovar recetas, por favor pida a su farmacia que se ponga en contacto con nuestra oficina. Nuestro nmero de fax es el [phone].  Si tiene un asunto urgente cuando la clnica est cerrada y que no puede esperar hasta el siguiente da hbil, puede llamar/localizar a su doctor(a) al nmero que aparece a continuacin.   Por favor, tenga en cuenta que aunque hacemos todo lo posible para estar disponibles para asuntos urgentes fuera del horario de oficina, no estamos disponibles las 24 horas del da, los 7 das de la semana.   Si tiene un problema urgente y no puede comunicarse con nosotros, puede optar por buscar atencin mdica  en el consultorio de su doctor(a), en una clnica privada, en un centro de atencin urgente o en una sala de emergencias.  Si tiene una emergencia mdica, por favor llame inmediatamente al 911 o vaya a la sala de emergencias.  Nmeros de bper  - Dr. Kowalski: [phone]  - Dra. Moye: [phone]  - Dra. Stewart: [phone]  En caso de inclemencias del tiempo, por favor llame a nuestra lnea principal al [phone] para una actualizacin sobre el estado de cualquier retraso o cierre.  Consejos para la medicacin en dermatologa: Por favor, guarde las cajas en las que vienen los medicamentos de uso tpico para ayudarle a seguir las instrucciones sobre dnde y cmo usarlos. Las farmacias generalmente imprimen las instrucciones del medicamento slo en las cajas y no directamente en los tubos del medicamento.   Si su medicamento es muy caro, por favor, pngase en contacto con nuestra oficina llamando al [phone] y presione la opcin 4 o envenos un mensaje a travs de MyChart.   No podemos decirle cul ser su copago por los medicamentos por adelantado ya que esto es diferente dependiendo de la cobertura de su seguro. Sin embargo, es posible que podamos encontrar un medicamento sustituto a menor costo o llenar un formulario para que el  seguro cubra el medicamento que se considera necesario.   Si se requiere una autorizacin previa para que su compaa de seguros cubra su medicamento, por favor permtanos de 1 a 2 das hbiles para completar este proceso.  Los precios de los medicamentos varan con frecuencia dependiendo del lugar de dnde se surte la receta y alguna farmacias pueden ofrecer precios ms baratos.  El sitio web www.goodrx.com tiene cupones para medicamentos de diferentes farmacias. Los precios aqu no tienen en cuenta lo que podra costar con la ayuda del seguro (puede ser ms barato con su seguro), pero el sitio web puede darle el precio si no utiliz ningn seguro.  - Puede imprimir el cupn correspondiente y llevarlo con su receta a la farmacia.  - Tambin puede pasar por nuestra oficina durante el horario de atencin regular y recoger una tarjeta de cupones de GoodRx.  - Si necesita que su receta se enve electrnicamente a una farmacia diferente, informe a nuestra oficina a travs de MyChart de [place]   o por telfono llamando al [phone] y presione la opcin 4.  
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
## 2022-05-10 ENCOUNTER — Encounter: Payer: Self-pay | Admitting: Dermatology

## 2022-05-14 ENCOUNTER — Ambulatory Visit: Payer: Self-pay | Admitting: Internal Medicine

## 2022-06-26 ENCOUNTER — Encounter: Payer: 59 | Attending: Physician Assistant | Admitting: Physician Assistant

## 2022-06-26 DIAGNOSIS — B354 Tinea corporis: Secondary | ICD-10-CM | POA: Insufficient documentation

## 2022-06-26 DIAGNOSIS — G8250 Quadriplegia, unspecified: Secondary | ICD-10-CM | POA: Diagnosis not present

## 2022-06-26 DIAGNOSIS — L89323 Pressure ulcer of left buttock, stage 3: Secondary | ICD-10-CM | POA: Insufficient documentation
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
## 2022-06-27 NOTE — Progress Notes (Signed)
[name], [name] ([id number]) [id number] Page 1 of 9 Visit Report for 06/26/2022 Allergy List Details Patient Name: Date of Service: [name], [name] [facility] 06/26/2022 8:15 A M Medical Record Number: [id number] Patient Account Number: [id number] Date of Birth/Sex: Treating RN: 1985-09-30 (37 y.o. [name] Primary Care [name]: [name] Other Clinician: Referring [name]: Treating [name]/Extender: [name] Self, Referral Weeks in Treatment: 0 Allergies Active Allergies No Known Allergies Allergy Notes Electronic Signature(s) Signed: 06/26/2022 3:58:45 PM By: [name] Entered By: [name] on 06/26/2022 08:40:51 -------------------------------------------------------------------------------- Arrival Information Details Patient Name: Date of Service: [name], [name] [facility] 06/26/2022 8:15 A M Medical Record Number: [id number] Patient Account Number: [id number] Date of Birth/Sex: Treating RN: October 29, 1985 (37 y.o. [name] Primary Care Amiir Heckard: [name] Other Clinician: Referring [name]: Treating [name]/Extender: [name] Self, Referral Weeks in Treatment: 0 Visit Information Patient Arrived: Wheel Chair Arrival Time: 08:17 Accompanied By: family Transfer Assistance: Harrel Lemon Lift History Since Last Visit Added or deleted any medications: No Any new allergies or adverse reactions: No Had a fall or experienced change in activities of daily living that may affect risk of falls: No Hospitalized since last visit: No Has Dressing in Place as Prescribed: Yes Electronic Signature(s) Signed: 06/26/2022 3:58:45 PM By: [name] Entered By: [name] on 06/26/2022 08:17:41 [name] ([id number]) [id number] Page 2 of 9 -------------------------------------------------------------------------------- Clinic Level of Care Assessment Details Patient Name: Date of Service: [name], [name] [facility]  06/26/2022 8:15 A M Medical Record Number: [id number] Patient Account Number: [id number] Date of Birth/Sex: Treating RN: 1985-11-28 (37 y.o. [name] Primary Care Divit Stipp: [name] Other Clinician: Referring [name]: Treating [name]/Extender: [name] Self, Referral Weeks in Treatment: 0 Clinic Level of Care Assessment Items TOOL 1 Quantity Score []  - 0 Use when EandM and Procedure is performed on INITIAL visit ASSESSMENTS - Nursing Assessment / Reassessment X- 1 20 General Physical Exam (combine w/ comprehensive assessment (listed just below) when performed on new pt. evals) X- 1 25 Comprehensive Assessment (HX, ROS, Risk Assessments, Wounds Hx, etc.) ASSESSMENTS - Wound and Skin Assessment / Reassessment X- 1 10 Dermatologic / Skin Assessment (not related to wound area) ASSESSMENTS - Ostomy and/or Continence Assessment and Care []  - 0 Incontinence Assessment and Management []  - 0 Ostomy Care Assessment and Management (repouching, etc.) PROCESS - Coordination of Care X - Simple Patient / Family Education for ongoing care 1 15 []  - 0 Complex (extensive) Patient / Family Education for ongoing care X- 1 10 Staff obtains Programmer, systems, Records, T Results / Process Orders est []  - 0 Staff telephones HHA, Nursing Homes / Clarify orders / etc []  - 0 Routine Transfer to another Facility (non-emergent condition) []  - 0 Routine Hospital Admission (non-emergent condition) X- 1 15 New Admissions / Biomedical engineer / Ordering NPWT Apligraf, etc. , []  - 0 Emergency Hospital Admission (emergent condition) PROCESS - Special Needs []  - 0 Pediatric / Minor Patient Management []  - 0 Isolation Patient Management []  - 0 Hearing / Language / Visual special needs []  - 0 Assessment of Community assistance (transportation, D/C planning, etc.) []  - 0 Additional assistance / Altered mentation X- 1 15 Support Surface(s) Assessment (bed, cushion, seat,  etc.) INTERVENTIONS - Miscellaneous []  - 0 External ear exam []  - 0 Patient Transfer (multiple staff / Civil Service fast streamer / Similar devices) []  - 0 Simple Staple / Suture removal (25 or less) []  - 0 Complex Staple / Suture removal (26 or more) [name], [name] ([id number].pdf Page 3 of 9 []  -  0 Hypo/Hyperglycemic Management (do not check if billed separately) []  - 0 Ankle / Brachial Index (ABI) - do not check if billed separately Has the patient been seen at the hospital within the last three years: Yes Total Score: 110 Level Of Care: New/Established - Level 3 Electronic Signature(s) Signed: 06/26/2022 3:58:45 PM By: [name] Entered By: [name] on 06/26/2022 12:33:29 -------------------------------------------------------------------------------- Encounter Discharge Information Details Patient Name: Date of Service: [name], [name] [facility] 06/26/2022 8:15 A M Medical Record Number: [id number] Patient Account Number: [id number] Date of Birth/Sex: Treating RN: [birth year] (37 y.o. [name] Primary Care Yonah Tangeman: [name] Other Clinician: Referring Izick Gasbarro: Treating Samanatha Brammer/Extender: [name] Self, Referral Weeks in Treatment: 0 Encounter Discharge Information Items Post Procedure Vitals Discharge Condition: Stable Temperature (F): 97.5 Ambulatory Status: Wheelchair Pulse (bpm): 64 Discharge Destination: Home Respiratory Rate (breaths/min): 18 Transportation: Private Auto Blood Pressure (mmHg): 122/70 Accompanied By: family Schedule Follow-up Appointment: Yes Clinical Summary of Care: Electronic Signature(s) Signed: 06/26/2022 12:37:26 PM By: [name] Entered By: [name] on 06/26/2022 12:37:26 -------------------------------------------------------------------------------- Lower Extremity Assessment Details Patient Name: Date of Service: [name], [name] 06/26/2022 8:15 A M Medical Record Number:  [id number] Patient Account Number: [id number] Date of Birth/Sex: Treating RN: 11-10-1985 (37 y.o. [name] Primary Care Ester Hilley: [name] Other Clinician: Referring Nakiesha Rumsey: Treating Noble Bodie/Extender: [name] Self, Referral Weeks in Treatment: 0 Electronic Signature(s) Signed: 06/26/2022 3:58:45 PM By: [name] Entered By: [name] on 06/26/2022 08:40:42 [name] ([id number].pdf Page 4 of 9 -------------------------------------------------------------------------------- Multi Wound Chart Details Patient Name: Date of Service: [name], [name] [facility] 06/26/2022 8:15 A M Medical Record Number: [id number] Patient Account Number: [id number] Date of Birth/Sex: Treating RN: 1986-01-15 (37 y.o. [name] Primary Care Josey Dettmann: [name] Other Clinician: Referring Makinzy Cleere: Treating Shamarra Warda/Extender: [name] Self, Referral Weeks in Treatment: 0 Vital Signs Height(in): Pulse(bpm): 64 Weight(lbs): 185 Blood Pressure(mmHg): 122/70 Body Mass Index(BMI): Temperature(F): 97.5 Respiratory Rate(breaths/min): 18 [8:Photos:] [N/A:N/A] Left Gluteus N/A N/A Wound Location: Pressure Injury N/A N/A Wounding Event: Pressure Ulcer N/A N/A Primary Etiology: Hypotension, Quadriplegia N/A N/A Comorbid History: 06/06/2022 N/A N/A Date Acquired: 0 N/A N/A Weeks of Treatment: Open N/A N/A Wound Status: No N/A N/A Wound Recurrence: 4x4x0.1 N/A N/A Measurements L x W x D (cm) 12.566 N/A N/A A (cm) : rea 1.257 N/A N/A Volume (cm) : Category/Stage III N/A N/A Classification: Medium N/A N/A Exudate A mount: Serosanguineous N/A N/A Exudate Type: red, brown N/A N/A Exudate Color: Epibole N/A N/A Wound Margin: Medium (34-66%) N/A N/A Granulation A mount: Red N/A N/A Granulation Quality: Medium (34-66%) N/A N/A Necrotic A mount: Fat Layer (Subcutaneous Tissue): Yes N/A N/A Exposed Structures: None  N/A N/A Epithelialization: wound edge folded over on one sectionN/A N/A Assessment Notes: Treatment Notes Electronic Signature(s) Signed: 06/26/2022 3:58:45 PM By: [name] Entered By: [name] on 06/26/2022 09:08:11 [name] ([id number]) [id number] Page 5 of 9 -------------------------------------------------------------------------------- Multi-Disciplinary Care Plan Details Patient Name: Date of Service: [name], [name] [facility] 06/26/2022 8:15 A M Medical Record Number: [id number] Patient Account Number: [id number] Date of Birth/Sex: Treating RN: [birth year] (37 y.o. [name] Primary Care Helmi Hechavarria: [name] Other Clinician: Referring Scottie Metayer: Treating Bridney Guadarrama/Extender: [name] Self, Referral Weeks in Treatment: 0 Active Inactive Orientation to the Wound Care Program Nursing Diagnoses: Knowledge deficit related to the wound healing center program Goals: Patient/caregiver will verbalize understanding of the [place] Program Date Initiated: 06/26/2022 Target Resolution Date: 07/03/2022 Goal Status: Active Interventions: Provide education on orientation to the  wound center Notes: Pressure Nursing Diagnoses: Knowledge deficit related to causes and risk factors for pressure ulcer development Knowledge deficit related to management of pressures ulcers Potential for impaired tissue integrity related to pressure, friction, moisture, and shear Goals: Patient will remain free from development of additional pressure ulcers Date Initiated: 06/26/2022 Target Resolution Date: 07/24/2022 Goal Status: Active Patient/caregiver will verbalize risk factors for pressure ulcer development Date Initiated: 06/26/2022 Target Resolution Date: 07/03/2022 Goal Status: Active Patient/caregiver will verbalize understanding of pressure ulcer management Date Initiated: 06/26/2022 Target Resolution Date: 07/03/2022 Goal Status:  Active Interventions: Assess: immobility, friction, shearing, incontinence upon admission and as needed Assess offloading mechanisms upon admission and as needed Assess potential for pressure ulcer upon admission and as needed Provide education on pressure ulcers Treatment Activities: Patient referred for pressure reduction/relief devices : 06/26/2022 Notes: Wound/Skin Impairment Nursing Diagnoses: Impaired tissue integrity Knowledge deficit related to ulceration/compromised skin integrity Goals: Ulcer/skin breakdown will have a volume reduction of 30% by week 4 Osbourne, Markevius ([id number]) [id number] Page 6 of 9 Date Initiated: 06/26/2022 Target Resolution Date: 07/24/2022 Goal Status: Active Ulcer/skin breakdown will have a volume reduction of 50% by week 8 Date Initiated: 06/26/2022 Target Resolution Date: 08/21/2022 Goal Status: Active Ulcer/skin breakdown will have a volume reduction of 80% by week 12 Date Initiated: 06/26/2022 Target Resolution Date: 09/18/2022 Goal Status: Active Ulcer/skin breakdown will heal within 14 weeks Date Initiated: 06/26/2022 Target Resolution Date: 10/02/2022 Goal Status: Active Interventions: Assess patient/caregiver ability to obtain necessary supplies Assess patient/caregiver ability to perform ulcer/skin care regimen upon admission and as needed Assess ulceration(s) every visit Provide education on ulcer and skin care Treatment Activities: Referred to DME Keisuke Hollabaugh for dressing supplies : 06/26/2022 Skin care regimen initiated : 06/26/2022 Notes: Electronic Signature(s) Signed: 06/26/2022 12:35:54 PM By: [name] Entered By: [name] on 06/26/2022 12:35:54 -------------------------------------------------------------------------------- Pain Assessment Details Patient Name: Date of Service: [name], [name] 06/26/2022 8:15 A M Medical Record Number: [id number] Patient Account Number: [id number] Date of  Birth/Sex: Treating RN: 1986/03/18 (37 y.o. [name] Primary Care Jarett Dralle: [name] Other Clinician: Referring Meadow Abramo: Treating Kassidy Frankson/Extender: [name] Self, Referral Weeks in Treatment: 0 Active Problems Location of Pain Severity and Description of Pain Patient Has Paino No Site Locations Rate the pain. Current Pain Level: 0 Pain Management and Medication [name], [name] ([id number]) [id number] Page 7 of 9 Current Pain Management: Notes quadriplegic Electronic Signature(s) Signed: 06/26/2022 3:58:45 PM By: [name] Entered By: [name] on 06/26/2022 08:17:58 -------------------------------------------------------------------------------- Patient/Caregiver Education Details Patient Name: Date of Service: [name] 3/21/2024andnbsp8:15 [place] Record Number: [id number] Patient Account Number: [id number] Date of Birth/Gender: Treating RN: [birth year] (37 y.o. [name] Primary Care Physician: [name] Other Clinician: Referring Physician: Treating Physician/Extender: [name] Self, Referral Weeks in Treatment: 0 Education Assessment Education Provided To: Patient and Caregiver Education Topics Provided Welcome T [facility]-New Patient Packet: o Handouts: The Wound Healing Pledge form, Welcome T [facility] [place] o Methods: Explain/Verbal Responses: State content correctly Wound Debridement: Handouts: Wound Debridement Methods: Explain/Verbal Responses: State content correctly Wound/Skin Impairment: Handouts: Caring for Your Ulcer Methods: Explain/Verbal Responses: State content correctly Electronic Signature(s) Signed: 06/26/2022 3:58:45 PM By: [name] Entered By: [name] on 06/26/2022 12:36:25 [name] ([id number]) [id number] Page 8 of  9 -------------------------------------------------------------------------------- Wound Assessment Details Patient Name: Date of Service: [name], [name] [facility] 06/26/2022 8:15 A M Medical Record Number: [id number] Patient Account Number: [id number] Date of Birth/Sex: Treating RN: [birth year] (37 y.o. Julieta Gutting,  [place] Primary Care Badr Piedra: [name] Other Clinician: Referring Bufford Helms: Treating Evelisse Szalkowski/Extender: [name] Self, Referral Weeks in Treatment: 0 Wound Status Wound Number: 8 Primary Pressure Ulcer Etiology: Wound Location: Left Gluteus Wound Open Wounding Event: Pressure Injury Status: Date Acquired: 06/06/2022 Notes: per family member wound has been there but has been small and Weeks Of Treatment: 0 not bad but after an episode of being in chair for a long time Clustered Wound: No Comorbid Hypotension, Quadriplegia History: Photos Wound Measurements Length: (cm) 4 Width: (cm) 4 Depth: (cm) 0.1 Area: (cm) 12.566 Volume: (cm) 1.257 % Reduction in Area: % Reduction in Volume: Epithelialization: None Tunneling: No Undermining: No Wound Description Classification: Category/Stage III Wound Margin: Epibole Exudate Amount: Medium Exudate Type: Serosanguineous Exudate Color: red, brown Foul Odor After Cleansing: No Slough/Fibrino Yes Wound Bed Granulation Amount: Medium (34-66%) Exposed Structure Granulation Quality: Red Fat Layer (Subcutaneous Tissue) Exposed: Yes Necrotic Amount: Medium (34-66%) Necrotic Quality: Adherent Slough Assessment Notes wound edge folded over on one section Treatment Notes Wound #8 (Gluteus) Wound Laterality: Left Cleanser [place] Ancillary Kit - 15 Day Supply Discharge Instruction: Use supplies as instructed; Kit contains: (15) Saline Bullets; (15) 3x3 Gauze; 15 pr Gloves Soap and Water Discharge Instruction: Gently cleanse wound with antibacterial soap, rinse and pat dry prior to dressing wounds Vashe 5.8 (oz) Discharge  Instruction: Use vashe 5.8 (oz) as directed Peri-Wound Care Topical Primary Dressing [facility] Blue Ready Transfer Foam, 2.5x2.5 (in/in) Guiffre, Aziel ([id number]) [phone].pdf Page 9 of 9 Discharge Instruction: Apply Hydrofera Blue Ready to wound bed as directed Secondary Dressing (BORDER) Zetuvit Plus SILICONE BORDER Dressing 5x5 (in/in) Discharge Instruction: Please do not put silicone bordered dressings under wraps. Use non-bordered dressing only. Secured With Compression Wrap Compression Stockings Add-Ons Electronic Signature(s) Signed: 06/26/2022 8:54:28 AM By: [name] Entered By: [name] on 06/26/2022 08:54:27 -------------------------------------------------------------------------------- Vitals Details Patient Name: Date of Service: [name], [facility] 06/26/2022 8:15 A M Medical Record Number: [id number] Patient Account Number: [id number] Date of Birth/Sex: Treating RN: [birth year] (37 y.o. [name] Primary Care Quinci Gavidia: [name] Other Clinician: Referring Gayathri Futrell: Treating Tulip Meharg/Extender: [name] Self, Referral Weeks in Treatment: 0 Vital Signs Time Taken: 08:18 Temperature (F): 97.5 Weight (lbs): 185 Pulse (bpm): 64 Source: Stated Respiratory Rate (breaths/min): 18 Blood Pressure (mmHg): 122/70 Reference Range: 80 - 120 mg / dl Electronic Signature(s) Signed: 06/26/2022 3:58:45 PM By: [name] Entered By: [name] on 06/26/2022 08:18:26

## 2022-06-27 NOTE — Progress Notes (Signed)
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 for: Hepatitis; Thyroid disease; Polydypsia (Excessive Thirst) Immunological Complaints and Symptoms: Negative for: Hives; Itching Musculoskeletal Complaints and Symptoms: Negative for: Muscle Pain; Muscle Weakness Psychiatric Complaints and Symptoms: Negative for: Anxiety; Claustrophobia Cardiovascular JERMAN, ENAMORADO ([id number]) [name] Page 9 of 10 Medical History: Positive for: Hypotension Genitourinary Complaints and Symptoms: Review of System Notes: urinary catheter Integumentary (Skin) Neurologic Medical History: Positive for: Quadriplegia Oncologic Immunizations Pneumococcal Vaccine: Received Pneumococcal Vaccination: No Implantable Devices None Family and Social History Former smoker; Marital Status - Divorced; Alcohol Use: Never; Drug Use: Current History - CBD; Caffeine Use: Daily; Financial Concerns: No;  Food, Clothing or Shelter Needs: No; Support System Lacking: No; Transportation Concerns: No Electronic Signature(s) Signed: 06/26/2022 3:58:45 PM By: [name] Signed: 06/26/2022 5:06:45 PM By: [name] Entered By: [name] on 06/26/2022 08:21:56 -------------------------------------------------------------------------------- SuperBill Details Patient Name: Date of Service: [name], [name] [facility] 06/26/2022 Medical Record Number: [id number] Patient Account Number: [id number] Date of Birth/Sex: Treating RN: 05/20/1985 (37 y.o. 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

## 2022-07-09 ENCOUNTER — Encounter: Payer: Self-pay | Admitting: Dermatology

## 2022-07-09 ENCOUNTER — Ambulatory Visit (INDEPENDENT_AMBULATORY_CARE_PROVIDER_SITE_OTHER): Payer: 59 | Admitting: Dermatology

## 2022-07-09 VITALS — BP 102/78 | HR 61

## 2022-07-09 DIAGNOSIS — L909 Atrophic disorder of skin, unspecified: Secondary | ICD-10-CM

## 2022-07-09 DIAGNOSIS — Z7189 Other specified counseling: Secondary | ICD-10-CM

## 2022-07-09 DIAGNOSIS — I872 Venous insufficiency (chronic) (peripheral): Secondary | ICD-10-CM | POA: Diagnosis not present

## 2022-07-09 DIAGNOSIS — R21 Rash and other nonspecific skin eruption: Secondary | ICD-10-CM | POA: Diagnosis not present

## 2022-07-09 DIAGNOSIS — Z79899 Other long term (current) drug therapy: Secondary | ICD-10-CM

## 2022-07-09 MED ORDER — TACROLIMUS 0.1 % EX OINT: TOPICAL_OINTMENT | Freq: Two times a day (BID) | CUTANEOUS | 2 refills | Status: DC

## 2022-07-09 MED ORDER — TACROLIMUS 0.1 % EX OINT: TOPICAL_OINTMENT | Freq: Two times a day (BID) | CUTANEOUS | 11 refills | Status: AC

## 2022-07-09 NOTE — Patient Instructions (Addendum)
Continue with compression stockings at legs   Apply tacrolimus ointment apply topically to affected areas daily  Continue with cerave moisturizer Continue with antifungal spray at areas daily        Due to recent changes in healthcare laws, you may see results of your pathology and/or laboratory studies on MyChart before the doctors have had a chance to review them. We understand that in some cases there may be results that are confusing or concerning to you. Please understand that not all results are received at the same time and often the doctors may need to interpret multiple results in order to provide you with the best plan of care or course of treatment. Therefore, we ask that you please give [place] 2 business days to thoroughly review all your results before contacting the office for clarification. Should we see a critical lab result, you will be contacted sooner.   If You Need Anything After Your Visit  If you have any questions or concerns for your doctor, please call our main line at [phone] and press option 4 to reach your doctor's medical assistant. If no one answers, please leave a voicemail as directed and we will return your call as soon as possible. Messages left after 4 pm will be answered the following business day.   You may also send [place] a message via [place]. We typically respond to MyChart messages within 1-2 business days.  For prescription refills, please ask your pharmacy to contact our office. Our fax number is [phone].  If you have an urgent issue when the clinic is closed that cannot wait until the next business day, you can page your doctor at the number below.    Please note that while we do our best to be available for urgent issues outside of office hours, we are not available 24/7.   If you have an urgent issue and are unable to reach [place], you may choose to seek medical care at your doctor's office, retail clinic, urgent care center, or emergency  room.  If you have a medical emergency, please immediately call 911 or go to the emergency department.  Pager Numbers  - Dr. Nehemiah Massed: [phone]  - Dr. Laurence Ferrari: [phone]  - Dr. Nicole Kindred: [phone]  In the event of inclement weather, please call our main line at [phone] for an update on the status of any delays or closures.  Dermatology Medication Tips: Please keep the boxes that topical medications come in in order to help keep track of the instructions about where and how to use these. Pharmacies typically print the medication instructions only on the boxes and not directly on the medication tubes.   If your medication is too expensive, please contact our office at [phone] option 4 or send [place] a message through [place].   We are unable to tell what your co-pay for medications will be in advance as this is different depending on your insurance coverage. However, we may be able to find a substitute medication at lower cost or fill out paperwork to get insurance to cover a needed medication.   If a prior authorization is required to get your medication covered by your insurance company, please allow [place] 1-2 business days to complete this process.  Drug prices often vary depending on where the prescription is filled and some pharmacies may offer cheaper prices.  The website www.goodrx.com contains coupons for medications through different pharmacies. The prices here do not account for what the cost may be with help  from insurance (it may be cheaper with your insurance), but the website can give you the price if you did not use any insurance.  - You can print the associated coupon and take it with your prescription to the pharmacy.  - You may also stop by our office during regular business hours and pick up a GoodRx coupon card.  - If you need your prescription sent electronically to a different pharmacy, notify our office through [facility] or by phone at [phone]  option 4.     Si Usted Necesita Algo Despus de Su Visita  Tambin puede enviarnos un mensaje a travs de Pharmacist, community. Por lo general respondemos a los mensajes de MyChart en el transcurso de 1 a 2 das hbiles.  Para renovar recetas, por favor pida a su farmacia que se ponga en contacto con nuestra oficina. Harland Dingwall de fax es [place] [phone].  Si tiene un asunto urgente cuando la clnica est cerrada y que no puede esperar hasta el siguiente da hbil, puede llamar/localizar a su doctor(a) al nmero que aparece a continuacin.   Por favor, tenga en cuenta que aunque hacemos todo lo posible para estar disponibles para asuntos urgentes fuera del horario de [place], no estamos disponibles las 24 horas del da, los 7 das de la [place].   Si tiene un problema urgente y no puede comunicarse con nosotros, puede optar por buscar atencin mdica  en el consultorio de su doctor(a), en una clnica privada, en un centro de atencin urgente o en una sala de emergencias.  Si tiene Engineering geologist, por favor llame inmediatamente al 911 o vaya a la sala de emergencias.  Nmeros de bper  - Dr. Nehemiah Massed: [phone]  - Dra. Moye: [phone]  - Dra. Nicole Kindred: [phone]  En caso de inclemencias del [place], por favor llame a Johnsie Kindred principal al [phone] para una actualizacin sobre el [place] de cualquier retraso o cierre.  Consejos para la medicacin en dermatologa: Por favor, guarde las cajas en las que vienen los medicamentos de uso tpico para ayudarle a seguir las instrucciones sobre dnde y cmo usarlos. Las farmacias generalmente imprimen las instrucciones del medicamento slo en las cajas y no directamente en los tubos del [place].   Si su medicamento es muy caro, por favor, pngase en contacto con Zigmund Daniel llamando al [phone] y presione la opcin 4 o envenos un mensaje a travs de Pharmacist, community.   No podemos decirle cul ser su copago por los medicamentos  por adelantado ya que esto es diferente dependiendo de la cobertura de su seguro. Sin embargo, es posible que podamos encontrar un medicamento sustituto a Electrical engineer un formulario para que el seguro cubra el medicamento que se considera necesario.   Si se requiere una autorizacin previa para que su compaa de seguros [place] su medicamento, por favor permtanos de 1 a 2 das hbiles para completar este proceso.  Los precios de los medicamentos varan con frecuencia dependiendo del Environmental consultant de dnde se surte la receta y alguna farmacias pueden ofrecer precios ms baratos.  El sitio web www.goodrx.com tiene cupones para medicamentos de Airline pilot. Los precios aqu no tienen en cuenta lo que podra costar con la ayuda del seguro (puede ser ms barato con su seguro), pero el sitio web puede darle el precio si no utiliz Research scientist (physical sciences).  - Puede imprimir el cupn correspondiente y llevarlo con su receta a la farmacia.  - Tambin puede pasar por nuestra oficina durante el horario  de atencin regular y Charity fundraiser una tarjeta de cupones de GoodRx.  - Si necesita que su receta se enve electrnicamente a una farmacia diferente, informe a nuestra oficina a travs de MyChart de [place] o por telfono llamando al [phone] y presione la opcin 4.
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
## 2022-07-10 ENCOUNTER — Encounter: Payer: 59 | Attending: Physician Assistant | Admitting: Physician Assistant

## 2022-07-10 DIAGNOSIS — L89323 Pressure ulcer of left buttock, stage 3: Secondary | ICD-10-CM | POA: Diagnosis present

## 2022-07-10 DIAGNOSIS — G8222 Paraplegia, incomplete: Secondary | ICD-10-CM | POA: Insufficient documentation

## 2022-07-10 DIAGNOSIS — L988 Other specified disorders of the skin and subcutaneous tissue: Secondary | ICD-10-CM | POA: Diagnosis not present

## 2022-07-10 DIAGNOSIS — B354 Tinea corporis: Secondary | ICD-10-CM | POA: Diagnosis not present

## 2022-07-11 NOTE — Progress Notes (Signed)
Rivka SpringDEESE, Cace ([id number]) [id number] Page 1 of 7 Visit Report for 07/10/2022 Chief Complaint Document Details Patient Name: Date of [facility]: [facility]00 PM Medical Record Number: [id number] Patient Account Number: [id number] Date of Birth/Sex: Treating RN: 1985-10-03 (37 y.o. Laymond PurserM) Gordon, Caitlin Primary Care Provider: Unknown FoleyWhitten, Robin Other Clinician: Referring Provider: Treating Provider/Extender: Jacklyn ShellStone, Diane Mochizuki Whitten, Robin Weeks in Treatment: 2 Information Obtained from: Patient Chief Complaint Left buttock ulcer Electronic Signature(s) Signed: 07/10/2022 1:38:34 PM By: Lenda KelpStone III, Levora Werden PA-C Entered By: Lenda KelpStone III, Sanya Kobrin on 07/10/2022 13:38:33 -------------------------------------------------------------------------------- Debridement Details Patient Name: Date of Service: [facility]ESE[facility]10[facility] 1:00 PM Medical Record Number: [id number] Patient Account Number: [id number] Date of Birth/Sex: Treating RN: 1985-10-03 (37 y.o. Laymond PurserM) Gordon, Caitlin Primary Care Provider: Unknown FoleyWhitten, Robin Other Clinician: Referring Provider: Treating Provider/Extender: Jacklyn ShellStone, Alejandrina Raimer Whitten, Robin Weeks in Treatment: 2 Debridement Performed for Assessment: Wound #8 Left Gluteus Performed By: Physician Nelida MeuseStone, Tanina Barb E., PA-C Debridement Type: Debridement Level of Consciousness (Pre-procedure): Awake and Alert Pre-procedure Verification/Time Out Yes - 13:52 Taken: Pain Control: Lidocaine 4% T opical Solution T Area Debrided (L x W): otal 3.9 (cm) x 2.2 (cm) = 8.58 (cm) Tissue and other material debrided: Viable, Non-Viable, Slough, Subcutaneous, Biofilm, Slough Level: Skin/Subcutaneous Tissue Debridement Description: Excisional Instrument: Curette Bleeding: Moderate Hemostasis Achieved: Pressure Response to Treatment: Procedure was tolerated well Level of Consciousness (Post- Awake and Alert procedure): Post Debridement Measurements of Total Wound Length: (cm)  3.9 Stage: Category/Stage III Width: (cm) 2.2 Depth: (cm) 0.1 Volume: (cm) 0.674 Character of Wound/Ulcer Post Debridement: Stable Post Procedure Diagnosis Same as Pre-procedure Electronic Signature(s) Signed: 07/10/2022 4:55:39 PM By: Angelina PihGordon, Caitlin Signed: 07/11/2022 8:47:01 AM By: Lenda KelpStone III, Katriana Dortch PA-C Entered By: Angelina PihGordon, Caitlin on 07/10/2022 13:56:05 HPI Details -------------------------------------------------------------------------------- [name], Lucan ([id number]) [id number] Page 2 of 7 Patient Name: Date of Service: [facility] 1:00 PM Medical Record Number: [id number] Patient Account Number: [id number] Date of Birth/Sex: Treating RN: 1985-10-03 (37 y.o. Laymond PurserM) Gordon, Caitlin Primary Care Provider: Unknown FoleyWhitten, Robin Other Clinician: Referring Provider: Treating Provider/Extender: Jacklyn ShellStone, Kelcy Baeten Whitten, Robin Weeks in Treatment: 2 History of Present Illness HPI Description: 03/18/2021 upon evaluation today patient presents for initial inspection here in the clinic concerning issues that he has been having with his back. Fortunately this seems to be related to multiple potential issues here which involve moisture, pressure, and I believe a fungal infection as well. In the past he seems to have responded to steroids topically. With that being said I am thinking that might still be something that could be helpful for him. I am going to see about setting this and to the pharmacy to get things started. I also think we may want to do a wound culture in order to evaluate for any possibilities there. 04/19/2021 upon evaluation today patient actually appears to be doing excellent in regard to his back. He is actually making wonderful progress as far as this is concerned. I do not see any signs of infection at this time which is great news and overall I think that he is doing quite well. Admission 10/30/2021 Mr. Rivka SpringRodney Fraiser is a 37 year old male with a past  medical history of paraplegia that presents with extensive wounds to his back and left leg. He was seen in our clinic earlier in the year for a similar presentation but limited to the back area. This was treated effectively with antifungal spray, triamcinolone cream and oral antibiotics. He again presents today with similar wounds to his back. Unfortunately he has also  developed wounds to his left lower extremity. He states that the wounds started after he was casted for a broken leg. He has also developed wounds to the left upper thigh. He states he was sunburned to this area and subsequently developed wounds. He has currently been keeping the wounds covered. He denies systemic signs of infection. 8/2; patient presents for follow-up. Has been using triamcinolone cream with antifungal spray on the wound beds. He has been taking Augmentin without issues. He reports improvement in wound healing. 8/16; patient presents for follow-up. He continues to use triamcinolone cream with antifungal spray to the wound beds. He has completed his course of Augmentin. He continues to report improvement in wound healing. He has no issues or complaints today. 9/13; patient presents for follow-up. He continues to use triamcinolone cream with antifungal spray with benefit to the wound healing. He developed a small skin tear to the right lower leg today by hitting this against an object. He has no issues or complaints today. He has not heard from dermatology from referral placed at first clinic visit. 10/11; patient presents for follow-up. He continues to use triamcinolone cream with antifungal spray to the wound beds with benefit in wound healing. He is scheduled to see dermatology in February. He has no issues or complaints today. 11/22; patient presents for follow-up. He continues to use triamcinolone cream with antifungal spray to the wound beds. The left lower extremity and upper thigh wounds have healed. His  remaining wounds are on his back. He is scheduled to see dermatology in February. He denies signs of infection. Readmission: 06-26-2022 patient presents today for reevaluation here in the clinic although previously he was seen due to an issue with a significant rash over his continuous back and gluteal region. This is actually being managed by dermatology he is actually seen today for completely separate issue. His issue today is that of having a wound in the left gluteal location. This unfortunately occurred as a perfect storm of unfortunately his Roho cushion no longer holding air and him having set up in his chair for about 10 hours total unbeknownst to him and causing damage to the gluteal region and a pressure ulcer which is a stage III pressure ulceration at this point. This is quite significant and is going to require a bit of time to get this to heal I do believe. It is not as bad as it could have been but also not good. 07-10-2022 upon evaluation today patient appears to be doing well currently in regard to his wound. He does have an open area however that still does have quite a bit of slough and biofilm noted on the surface of the wound. I am going to have to perform some debridement today he was a little nervous about this but nonetheless he did consent to it. I would describe that in the next section. Electronic Signature(s) Signed: 07/10/2022 6:05:38 PM By: Lenda KelpStone III, Yareli Carthen PA-C Entered By: Lenda KelpStone III, Gavin Faivre on 07/10/2022 18:05:38 -------------------------------------------------------------------------------- Physical Exam Details Patient Name: Date of Service: Gery PrayDEESE, RO DNEY 07/10/2022 1:00 PM Medical Record Number: [id number] Patient Account Number: [id number] Date of Birth/Sex: Treating RN: 24-Mar-1986 (37 y.o. Laymond PurserM) Gordon, Caitlin Primary Care Provider: Unknown FoleyWhitten, Robin Other Clinician: Referring Provider: Treating Provider/Extender: Jacklyn ShellStone, Cherise Fedder Whitten, Robin Weeks in Treatment:  2 Constitutional Well-nourished and well-hydrated in no acute distress. Eyes unequal pupils. Respiratory normal breathing without difficulty. Psychiatric Rivka SpringDEESE, Dustyn ([id number]) [id number] Page 3 of 7 this patient is able to make decisions and  demonstrates good insight into disease process. Alert and Oriented x 3. pleasant and cooperative. Notes Upon inspection patient's wound bed did require sharp debridement and again clearing away the slough and biofilm on the surface of the wound the patient was a little nervous about this he thought that it was going to hurt. With that being said I was able to perform debridement without causing any pain he did have bleeding I did not have to use any aggressive measures for hemostasis pressure is all it took. Postdebridement the wound looks much better. Electronic Signature(s) Signed: 07/10/2022 6:06:00 PM By: Lenda Kelp PA-C Entered By: Lenda Kelp on 07/10/2022 18:06:00 -------------------------------------------------------------------------------- Physician Orders Details Patient Name: Date of Service: ODDIE, HECKLE [facility] 07/10/2022 1:00 PM Medical Record Number: [id number] Patient Account Number: [id number] Date of Birth/Sex: Treating RN: [birth year] (37 y.o. [name] Primary Care Provider: Unknown Foley Other Clinician: Referring Provider: Treating Provider/Extender: Jacklyn Shell Weeks in Treatment: 2 Verbal / Phone Orders: No Diagnosis Coding Follow-up Appointments Return Appointment in 2 weeks. Home Health Home Health Company: - [facility] for wound care. May utilize formulary equivalent dressing for wound treatment orders unless otherwise specified. Home Health Nurse may visit PRN to address patients wound care needs. Scheduled days for dressing changes to be completed; exception, patient has scheduled wound care visit that day. **Please direct any NON-WOUND  related issues/requests for orders to patient's Primary Care Physician. **If current dressing causes regression in wound condition, may D/C ordered dressing product/s and apply Normal Saline Moist Dressing daily until next Wound Healing Center or Other MD appointment. **Notify Wound Healing Center of regression in wound condition at [phone]. Bathing/ Shower/ Hygiene May shower; gently cleanse wound with antibacterial soap, rinse and pat dry prior to dressing wounds No tub bath. Anesthetic (Use 'Patient Medications' Section for Anesthetic Order Entry) Lidocaine applied to wound bed Off-Loading Wound #8 Left Gluteus Roho cushion for wheelchair - one off [employer] recommended until can get replacement. Turn and reposition every 2 hours Wound Treatment Wound #8 - Gluteus Wound Laterality: Left Cleanser: Byram Ancillary Kit - 15 Day Supply (Generic) 3 x Per Week/30 Days Discharge Instructions: Use supplies as instructed; Kit contains: (15) Saline Bullets; (15) 3x3 Gauze; 15 pr Gloves Cleanser: Soap and Water 3 x Per Week/30 Days Discharge Instructions: Gently cleanse wound with antibacterial soap, rinse and pat dry prior to dressing wounds Cleanser: Vashe 5.8 (oz) 3 x Per Week/30 Days Discharge Instructions: Use vashe 5.8 (oz) as directed Prim Dressing: Hydrofera Blue Ready Transfer Foam, 2.5x2.5 (in/in) (Generic) 3 x Per Week/30 Days ary Discharge Instructions: Apply Hydrofera Blue Ready to wound bed as directed Secondary Dressing: (BORDER) Zetuvit Plus SILICONE BORDER Dressing 5x5 (in/in) (Generic) 3 x Per Week/30 Days Discharge Instructions: Please do not put silicone bordered dressings under wraps. Use non-bordered dressing only. Electronic Signature(s) Signed: 07/10/2022 4:55:39 PM By: Angelina Pih Signed: 07/11/2022 8:47:01 AM By: Lenda Kelp PA-C Previous Signature: 07/10/2022 1:23:04 PM Version By: Angelina Pih Entered By: Angelina Pih on 07/10/2022 13:24:08 [name]  ([id number]) [id number] Page 4 of 7 -------------------------------------------------------------------------------- Problem List Details Patient Name: Date of Service: ZAFAR, GIANNI [facility] 07/10/2022 1:00 PM Medical Record Number: [id number] Patient Account Number: [id number] Date of Birth/Sex: Treating RN: 07-Jan-1986 (37 y.o. [name] Primary Care Provider: Unknown Foley Other Clinician: Referring Provider: Treating Provider/Extender: Jacklyn Shell Weeks in Treatment: 2 Active Problems ICD-10 Encounter Code Description Active Date MDM Diagnosis [phone] Pressure ulcer of left buttock,  stage 3 06/26/2022 No Yes G82.22 Paraplegia, incomplete 06/26/2022 No Yes B35.4 Tinea corporis 06/26/2022 No Yes L98.8 Other specified disorders of the skin and subcutaneous tissue 06/26/2022 No Yes Inactive Problems Resolved Problems Electronic Signature(s) Signed: 07/10/2022 1:38:26 PM By: Lenda Kelp PA-C Entered By: Lenda Kelp on 07/10/2022 13:38:26 -------------------------------------------------------------------------------- Progress Note Details Patient Name: Date of Service: MAXAMILIAN, AMADON 07/10/2022 1:00 PM Medical Record Number: [id number] Patient Account Number: [id number] Date of Birth/Sex: Treating RN: July 02, 1985 (37 y.o. [name] Primary Care Provider: Unknown Foley Other Clinician: Referring Provider: Treating Provider/Extender: Jacklyn Shell Weeks in Treatment: 2 Subjective Chief Complaint Information obtained from Patient Left buttock ulcer History of Present Illness (HPI) 03/18/2021 upon evaluation today patient presents for initial inspection here in the clinic concerning issues that he has been having with his back. Fortunately this seems to be related to multiple potential issues here which involve moisture, pressure, and I believe a fungal infection as well. In the past he seems to have responded to  steroids topically. With that being said I am thinking that might still be something that could be helpful for him. I am going to see about setting this and to the pharmacy to get things started. I also think we may want to do a wound culture in order to evaluate for any possibilities there. 04/19/2021 upon evaluation today patient actually appears to be doing excellent in regard to his back. He is actually making wonderful progress as far as this is concerned. I do not see any signs of infection at this time which is great news and overall I think that he is doing quite well. Admission 10/30/2021 Mr. Keino Placencia is a 37 year old male with a past medical history of paraplegia that presents with extensive wounds to his back and left leg. He was seen in our clinic earlier in the year for a similar presentation but limited to the back area. This was treated effectively with antifungal spray, triamcinolone cream and oral antibiotics. He again presents today with similar wounds to his back. Unfortunately he has also developed wounds to his left lower extremity. He states that the wounds started after he was casted for a broken leg. He has also developed wounds to the left upper thigh. He states he was sunburned to this area and subsequently developed wounds. He has currently been keeping the wounds covered. He denies systemic signs of infection. 8/2; patient presents for follow-up. Has been using triamcinolone cream with antifungal spray on the wound beds. He has been taking Augmentin without issues. He reports improvement in wound healing. WESSON, STITH ([id number]) [id number] Page 5 of 7 8/16; patient presents for follow-up. He continues to use triamcinolone cream with antifungal spray to the wound beds. He has completed his course of Augmentin. He continues to report improvement in wound healing. He has no issues or complaints today. 9/13; patient presents for follow-up. He  continues to use triamcinolone cream with antifungal spray with benefit to the wound healing. He developed a small skin tear to the right lower leg today by hitting this against an object. He has no issues or complaints today. He has not heard from dermatology from referral placed at first clinic visit. 10/11; patient presents for follow-up. He continues to use triamcinolone cream with antifungal spray to the wound beds with benefit in wound healing. He is scheduled to see dermatology in February. He has no issues or complaints today. 11/22; patient presents for follow-up. He continues to use triamcinolone  cream with antifungal spray to the wound beds. The left lower extremity and upper thigh wounds have healed. His remaining wounds are on his back. He is scheduled to see dermatology in February. He denies signs of infection. Readmission: 06-26-2022 patient presents today for reevaluation here in the clinic although previously he was seen due to an issue with a significant rash over his continuous back and gluteal region. This is actually being managed by dermatology he is actually seen today for completely separate issue. His issue today is that of having a wound in the left gluteal location. This unfortunately occurred as a perfect storm of unfortunately his Roho cushion no longer holding air and him having set up in his chair for about 10 hours total unbeknownst to him and causing damage to the gluteal region and a pressure ulcer which is a stage III pressure ulceration at this point. This is quite significant and is going to require a bit of time to get this to heal I do believe. It is not as bad as it could have been but also not good. 07-10-2022 upon evaluation today patient appears to be doing well currently in regard to his wound. He does have an open area however that still does have quite a bit of slough and biofilm noted on the surface of the wound. I am going to have to perform some  debridement today he was a little nervous about this but nonetheless he did consent to it. I would describe that in the next section. Objective Constitutional Well-nourished and well-hydrated in no acute distress. Vitals Time Taken: 1:10 PM, Weight: 185 lbs, Temperature: 97.7 F, Pulse: 58 bpm, Respiratory Rate: 18 breaths/min, Blood Pressure: 134/90 mmHg. Eyes unequal pupils. Respiratory normal breathing without difficulty. Psychiatric this patient is able to make decisions and demonstrates good insight into disease process. Alert and Oriented x 3. pleasant and cooperative. General Notes: Upon inspection patient's wound bed did require sharp debridement and again clearing away the slough and biofilm on the surface of the wound the patient was a little nervous about this he thought that it was going to hurt. With that being said I was able to perform debridement without causing any pain he did have bleeding I did not have to use any aggressive measures for hemostasis pressure is all it took. Postdebridement the wound looks much better. Integumentary (Hair, Skin) Wound #8 status is Open. Original cause of wound was Pressure Injury. The date acquired was: 06/06/2022. The wound has been in treatment 2 weeks. The wound is located on the Left Gluteus. The wound measures 3.9cm length x 2.2cm width x 0.1cm depth; 6.739cm^2 area and 0.674cm^3 volume. There is Fat Layer (Subcutaneous Tissue) exposed. There is no tunneling or undermining noted. There is a medium amount of serosanguineous drainage noted. The wound margin is epibole. There is small (1-33%) red granulation within the wound bed. There is a large (67-100%) amount of necrotic tissue within the wound bed including Adherent Slough. Assessment Active Problems ICD-10 Pressure ulcer of left buttock, stage 3 Paraplegia, incomplete Tinea corporis Other specified disorders of the skin and subcutaneous tissue Procedures Wound #8 Pre-procedure  diagnosis of Wound #8 is a Pressure Ulcer located on the Left Gluteus . There was a Excisional Skin/Subcutaneous Tissue Debridement with a total area of 8.58 sq cm performed by Nelida Meuse., PA-C. With the following instrument(s): Curette to remove Viable and Non-Viable tissue/material. Material removed includes Subcutaneous Tissue, Slough, and Biofilm after achieving pain control using Lidocaine 4%  Topical Solution. No specimens were ESTUARDO, FRISBEE ([id number]) [id number] Page 6 of 7 taken. A time out was conducted at 13:52, prior to the start of the procedure. A Moderate amount of bleeding was controlled with Pressure. The procedure was tolerated well. Post Debridement Measurements: 3.9cm length x 2.2cm width x 0.1cm depth; 0.674cm^3 volume. Post debridement Stage noted as Category/Stage III. Character of Wound/Ulcer Post Debridement is stable. Post procedure Diagnosis Wound #8: Same as Pre-Procedure Plan Follow-up Appointments: Return Appointment in 2 weeks. Home Health: Home Health Company: - [facility] for wound care. May utilize formulary equivalent dressing for wound treatment orders unless otherwise specified. Home Health Nurse may visit PRN to address patientoos wound care needs. Scheduled days for dressing changes to be completed; exception, patient has scheduled wound care visit that day. **Please direct any NON-WOUND related issues/requests for orders to patient's Primary Care Physician. **If current dressing causes regression in wound condition, may D/C ordered dressing product/s and apply Normal Saline Moist Dressing daily until next Wound Healing Center or Other MD appointment. **Notify Wound Healing Center of regression in wound condition at [phone]. Bathing/ Shower/ Hygiene: May shower; gently cleanse wound with antibacterial soap, rinse and pat dry prior to dressing wounds No tub bath. Anesthetic (Use 'Patient Medications'  Section for Anesthetic Order Entry): Lidocaine applied to wound bed Off-Loading: Wound #8 Left Gluteus: Roho cushion for wheelchair - one off [employer] recommended until can get replacement. Turn and reposition every 2 hours WOUND #8: - Gluteus Wound Laterality: Left Cleanser: Byram Ancillary Kit - 15 Day Supply (Generic) 3 x Per Week/30 Days Discharge Instructions: Use supplies as instructed; Kit contains: (15) Saline Bullets; (15) 3x3 Gauze; 15 pr Gloves Cleanser: Soap and Water 3 x Per Week/30 Days Discharge Instructions: Gently cleanse wound with antibacterial soap, rinse and pat dry prior to dressing wounds Cleanser: Vashe 5.8 (oz) 3 x Per Week/30 Days Discharge Instructions: Use vashe 5.8 (oz) as directed Prim Dressing: Hydrofera Blue Ready Transfer Foam, 2.5x2.5 (in/in) (Generic) 3 x Per Week/30 Days ary Discharge Instructions: Apply Hydrofera Blue Ready to wound bed as directed Secondary Dressing: (BORDER) Zetuvit Plus SILICONE BORDER Dressing 5x5 (in/in) (Generic) 3 x Per Week/30 Days Discharge Instructions: Please do not put silicone bordered dressings under wraps. Use non-bordered dressing only. 1. Would recommend currently that we have the patient continue to monitor for any signs of worsening or infection obviously if anything changes he knows to contact the office let me know. 2. I am also can recommend the patient should continue to monitor for any evidence of infection or worsening obviously if anything changes he knows contact the office and let me know. We will see patient back for reevaluation in 1 week here in the clinic. If anything worsens or changes patient will contact our office for additional recommendations. Electronic Signature(s) Signed: 07/10/2022 6:06:25 PM By: Lenda Kelp PA-C Entered By: Lenda Kelp on 07/10/2022 18:06:25 -------------------------------------------------------------------------------- SuperBill Details Patient Name: Date of  Service: CAMMERON, GREIS [facility] 07/10/2022 Medical Record Number: [id number] Patient Account Number: [id number] Date of Birth/Sex: Treating RN: 08/27/1985 (37 y.o. 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

## 2022-07-11 NOTE — Progress Notes (Signed)
[name], [place] ([id number]) [id number] Page 1 of 6 Visit Report for 07/10/2022 Arrival Information Details Patient Name: Date of Service: [name], Robert [facility][name] 07/10/2022 1:00 PM Medical Record Number: [id number] Patient Account Number: [id number] Date of Birth/Sex: Treating RN: [birth year][name] (37 y.o. [name]) Gordon, Caitlin Primary Care Ulises Wolfinger: Unknown FoleyWhitten, Robin Other Clinician: Referring Shawnae Leiva: Treating Darlyn Repsher/Extender: Jacklyn ShellStone, Hoyt Whitten, Robin Weeks in Treatment: 2 Visit Information History Since Last Visit Added or deleted any medications: No Patient Arrived: Wheel Chair Any new allergies or adverse reactions: No Arrival Time: 13:06 Had a fall or experienced change in No Accompanied By: family activities of daily living that Robert affect Transfer Assistance: None risk of falls: Patient Identification Verified: Yes Signs or symptoms of abuse/neglect since last visito No Secondary Verification Process Completed: Yes Has Dressing in Place as Prescribed: Yes Pain Present Now: Yes Electronic Signature(s) Signed: 07/10/2022 4:55:39 PM By: Angelina PihGordon, Caitlin Entered By: Angelina PihGordon, Caitlin on 07/10/2022 13:12:54 -------------------------------------------------------------------------------- Clinic Level of Care Assessment Details Patient Name: Date of Service: [name], [name] 07/10/2022 1:00 PM Medical Record Number: [id number] Patient Account Number: [id number] Date of Birth/Sex: Treating RN: [birth year][name] (37 y.o. [name]) Gordon, Caitlin Primary Care Dayelin Balducci: Unknown FoleyWhitten, Robin Other Clinician: Referring Dontrae Morini: Treating Manette Doto/Extender: Jacklyn ShellStone, Hoyt Whitten, Robin Weeks in Treatment: 2 Clinic Level of Care Assessment Items TOOL 1 Quantity Score []  - 0 Use when EandM and Procedure is performed on INITIAL visit ASSESSMENTS - Nursing Assessment / Reassessment []  - 0 General Physical Exam (combine w/ comprehensive assessment (listed just below) when performed on new pt. evals) []  -  0 Comprehensive Assessment (HX, ROS, Risk Assessments, Wounds Hx, etc.) ASSESSMENTS - Wound and Skin Assessment / Reassessment []  - 0 Dermatologic / Skin Assessment (not related to wound area) ASSESSMENTS - Ostomy and/or Continence Assessment and Care []  - 0 Incontinence Assessment and Management []  - 0 Ostomy Care Assessment and Management (repouching, etc.) PROCESS - Coordination of Care []  - 0 Simple Patient / Family Education for ongoing care []  - 0 Complex (extensive) Patient / Family Education for ongoing care []  - 0 Staff obtains ChiropractorConsents, Records, T Results / Process Orders est []  - 0 Staff telephones HHA, Nursing Homes / Clarify orders / etc []  - 0 Routine Transfer to another Facility (non-emergent condition) []  - 0 Routine Hospital Admission (non-emergent condition) []  - 0 New Admissions / Insurance Authorizations / Ordering NPWT Apligraf, etc. , []  - 0 Emergency Hospital Admission (emergent condition) PROCESS - Special Needs [name], [name] ([id number]) [id number] Page 2 of 6 []  - 0 Pediatric / Minor Patient Management []  - 0 Isolation Patient Management []  - 0 Hearing / Language / Visual special needs []  - 0 Assessment of Community assistance (transportation, D/C planning, etc.) []  - 0 Additional assistance / Altered mentation []  - 0 Support Surface(s) Assessment (bed, cushion, seat, etc.) INTERVENTIONS - Miscellaneous []  - 0 External ear exam []  - 0 Patient Transfer (multiple staff / Nurse, adultHoyer Lift / Similar devices) []  - 0 Simple Staple / Suture removal (25 or less) []  - 0 Complex Staple / Suture removal (26 or more) []  - 0 Hypo/Hyperglycemic Management (do not check if billed separately) []  - 0 Ankle / Brachial Index (ABI) - do not check if billed separately Has the patient been seen at the hospital within the last three years: Yes Total Score: 0 Level Of Care: ____ Electronic Signature(s) Signed: 07/10/2022 4:55:39 PM By:  Angelina PihGordon, Caitlin Entered By: Angelina PihGordon, Caitlin on 07/10/2022 16:47:55 -------------------------------------------------------------------------------- Encounter Discharge Information Details Patient Name: Date of Service: Robert EgeEESE, Robert  [name] 07/10/2022 1:00 PM Medical Record Number: [id number] Patient Account Number: [id number] Date of Birth/Sex: Treating RN: [name], [name] (37 y.o. [name]) Gordon, Caitlin Primary Care Paige Monarrez: Unknown FoleyWhitten, Robin Other Clinician: Referring Oliviana Mcgahee: Treating Charnise Lovan/Extender: Jacklyn ShellStone, Hoyt Whitten, Robin Weeks in Treatment: 2 Encounter Discharge Information Items Post Procedure Vitals Discharge Condition: Stable Temperature (F): 97.7 Ambulatory Status: Wheelchair Pulse (bpm): 58 Discharge Destination: Home Respiratory Rate (breaths/min): 18 Transportation: Private Auto Blood Pressure (mmHg): 134/90 Accompanied By: sister Schedule Follow-up Appointment: Yes Clinical Summary of Care: Electronic Signature(s) Signed: 07/10/2022 4:50:21 PM By: Angelina PihGordon, Caitlin Entered By: Angelina PihGordon, Caitlin on 07/10/2022 16:50:21 -------------------------------------------------------------------------------- Lower Extremity Assessment Details Patient Name: Date of Service: [name], [name] 07/10/2022 1:00 PM Medical Record Number: [id number] Patient Account Number: [id number] Date of Birth/Sex: Treating RN: [name], [name] (37 y.o. [name]) Gordon, Caitlin Primary Care Nur Krasinski: Unknown FoleyWhitten, Robin Other Clinician: Referring Dream Harman: Treating Estha Few/Extender: Jacklyn ShellStone, Hoyt Whitten, Robin Weeks in Treatment: 2 Electronic Signature(s) Signed: 07/10/2022 1:20:37 PM By: Angelina PihGordon, Caitlin Entered By: Angelina PihGordon, Caitlin on 07/10/2022 13:20:37 Robert SpringEESE, [name] ([id number]) [id number] Page 3 of 6 -------------------------------------------------------------------------------- Multi-Disciplinary Care Plan Details Patient Name: Date of Service: [name], [name] 07/10/2022 1:00 PM Medical Record Number:  [id number] Patient Account Number: [id number] Date of Birth/Sex: Treating RN: [name], [name] (37 y.o. [name]) Gordon, Caitlin Primary Care Yuriel Lopezmartinez: Unknown FoleyWhitten, Robin Other Clinician: Referring Anthony Tamburo: Treating Trevor Duty/Extender: Jacklyn ShellStone, Hoyt Whitten, Robin Weeks in Treatment: 2 Active Inactive Necrotic Tissue Nursing Diagnoses: Impaired tissue integrity related to necrotic/devitalized tissue Goals: Necrotic/devitalized tissue will be minimized in the wound bed Date Initiated: 07/10/2022 Target Resolution Date: 08/05/2022 Goal Status: Active Patient/caregiver will verbalize understanding of reason and process for debridement of necrotic tissue Date Initiated: 07/10/2022 Date Inactivated: 07/10/2022 Target Resolution Date: 07/10/2022 Goal Status: Met Interventions: Assess patient pain level pre-, during and post procedure and prior to discharge Provide education on necrotic tissue and debridement process Notes: Pressure Nursing Diagnoses: Knowledge deficit related to causes and risk factors for pressure ulcer development Knowledge deficit related to management of pressures ulcers Potential for impaired tissue integrity related to pressure, friction, moisture, and shear Goals: Patient will remain free from development of additional pressure ulcers Date Initiated: 06/26/2022 Target Resolution Date: 07/24/2022 Goal Status: Active Patient/caregiver will verbalize risk factors for pressure ulcer development Date Initiated: 06/26/2022 Date Inactivated: 07/10/2022 Target Resolution Date: 07/03/2022 Goal Status: Met Patient/caregiver will verbalize understanding of pressure ulcer management Date Initiated: 06/26/2022 Date Inactivated: 07/10/2022 Target Resolution Date: 07/03/2022 Goal Status: Met Interventions: Assess: immobility, friction, shearing, incontinence upon admission and as needed Assess offloading mechanisms upon admission and as needed Assess potential for pressure ulcer upon admission and as  needed Provide education on pressure ulcers Treatment Activities: Patient referred for pressure reduction/relief devices : 06/26/2022 Notes: Wound/Skin Impairment Nursing Diagnoses: Impaired tissue integrity Knowledge deficit related to ulceration/compromised skin integrity Goals: Ulcer/skin breakdown will have a volume reduction of 30% by week 4 Date Initiated: 06/26/2022 Target Resolution Date: 07/24/2022 Goal Status: Active Ulcer/skin breakdown will have a volume reduction of 50% by week 8 Date Initiated: 06/26/2022 Target Resolution Date: 08/21/2022 Goal Status: Active Ulcer/skin breakdown will have a volume reduction of 80% by week 12 Ballantine, Tieler ([id number]) [id number] Page 4 of 6 Date Initiated: 06/26/2022 Target Resolution Date: 09/18/2022 Goal Status: Active Ulcer/skin breakdown will heal within 14 weeks Date Initiated: 06/26/2022 Target Resolution Date: 10/02/2022 Goal Status: Active Interventions: Assess patient/caregiver ability to obtain necessary supplies Assess patient/caregiver ability to perform ulcer/skin care regimen upon admission and as needed Assess ulceration(s) every visit Provide education on ulcer and skin care  Treatment Activities: Referred to DME Zamia Tyminski for dressing supplies : 06/26/2022 Skin care regimen initiated : 06/26/2022 Notes: Electronic Signature(s) Signed: 07/10/2022 4:49:19 PM By: Angelina Pih Previous Signature: 07/10/2022 4:48:26 PM Version By: Angelina Pih Entered By: Angelina Pih on 07/10/2022 16:49:19 -------------------------------------------------------------------------------- Pain Assessment Details Patient Name: Date of Service: [name], [name] 07/10/2022 1:00 PM Medical Record Number: [id number] Patient Account Number: [id number] Date of Birth/Sex: Treating RN: [birth year] (37 y.o. Robert Purser Primary Care Alessander Sikorski: Unknown Foley Other Clinician: Referring Shubham Thackston: Treating  Leilynn Pilat/Extender: Jacklyn Shell Weeks in Treatment: 2 Active Problems Location of Pain Severity and Description of Pain Patient Has Paino Yes Site Locations Rate the pain. Current Pain Level: 3 Pain Management and Medication Current Pain Management: Notes pt states pain in bottom Electronic Signature(s) Signed: 07/10/2022 4:55:39 PM By: Angelina Pih Entered By: Angelina Pih on 07/10/2022 13:13:32 Patient/Caregiver Education Details -------------------------------------------------------------------------------- Robert Spring ([id number]) [id number] Page 5 of 6 Patient Name: Date of Service: [name], [name] 4/4/2024andnbsp1:00 PM Medical Record Number: [id number] Patient Account Number: [id number] Date of Birth/Gender: Treating RN: [birth year][name] (37 y.o. Robert Purser Primary Care Physician: Unknown Foley Other Clinician: Referring Physician: Treating Physician/Extender: Philbert Riser in Treatment: 2 Education Assessment Education Provided To: Patient and Caregiver Education Topics Provided Wound Debridement: Handouts: Wound Debridement Methods: Explain/Verbal Responses: State content correctly Wound/Skin Impairment: Handouts: Caring for Your Ulcer Methods: Explain/Verbal Responses: State content correctly Electronic Signature(s) Signed: 07/10/2022 4:55:39 PM By: Angelina Pih Entered By: Angelina Pih on 07/10/2022 16:48:39 -------------------------------------------------------------------------------- Wound Assessment Details Patient Name: Date of Service: [name], [name] 07/10/2022 1:00 PM Medical Record Number: [id number] Patient Account Number: [id number] Date of Birth/Sex: Treating RN: [name][birth year] (36 y.o. Robert Purser Primary Care Shanon Seawright: Unknown Foley Other Clinician: Referring Alizah Sills: Treating Naesha Buckalew/Extender: Jacklyn Shell Weeks in Treatment: 2 Wound Status Wound  Number: 8 Primary Pressure Ulcer Etiology: Wound Location: Left Gluteus Wound Open Wounding Event: Pressure Injury Status: Date Acquired: 06/06/2022 Notes: per family member wound has been there but has been small and Weeks Of Treatment: 2 not bad but after an episode of being in chair for a long time Clustered Wound: No Comorbid Hypotension, Quadriplegia History: Photos Wound Measurements Length: (cm) 3.9 Width: (cm) 2.2 Depth: (cm) 0.1 Area: (cm) 6.739 Volume: (cm) 0.674 % Reduction in Area: 46.4% % Reduction in Volume: 46.4% Epithelialization: None Tunneling: No Undermining: No Wound Description [name], [name] ([id number]) Classification: Category/Stage III Wound Margin: Epibole Exudate Amount: Medium Exudate Type: Serosanguineous Exudate Color: red, brown [id number] Page 6 of 6 Foul Odor After Cleansing: No Slough/Fibrino Yes Wound Bed Granulation Amount: Small (1-33%) Exposed Structure Granulation Quality: Red Fat Layer (Subcutaneous Tissue) Exposed: Yes Necrotic Amount: Large (67-100%) Necrotic Quality: Adherent Slough Treatment Notes Wound #8 (Gluteus) Wound Laterality: Left Cleanser Byram Ancillary Kit - 15 Day Supply Discharge Instruction: Use supplies as instructed; Kit contains: (15) Saline Bullets; (15) 3x3 Gauze; 15 pr Gloves Soap and Water Discharge Instruction: Gently cleanse wound with antibacterial soap, rinse and pat dry prior to dressing wounds Vashe 5.8 (oz) Discharge Instruction: Use vashe 5.8 (oz) as directed Peri-Wound Care Topical Primary Dressing Hydrofera Blue Ready Transfer Foam, 2.5x2.5 (in/in) Discharge Instruction: Apply Hydrofera Blue Ready to wound bed as directed Secondary Dressing (BORDER) Zetuvit Plus SILICONE BORDER Dressing 5x5 (in/in) Discharge Instruction: Please do not put silicone bordered dressings under wraps. Use non-bordered dressing only. Secured With Compression Wrap Compression  Stockings Add-Ons Electronic Signature(s) Signed: 07/10/2022 4:47:24 PM By: Angelina Pih Entered By: Angelina Pih on  07/10/2022 16:47:24 -------------------------------------------------------------------------------- Vitals Details Patient Name: Date of Service: ZAC, SCHLICHER [facility] 07/10/2022 1:00 PM Medical Record Number: [id number] Patient Account Number: [id number] Date of Birth/Sex: Treating RN: [name][birth year][name] (37 y.o. Robert Purser Primary Care Welda Azzarello: Unknown Foley Other Clinician: Referring Jazzie Trampe: Treating Alexander Mcauley/Extender: Jacklyn Shell Weeks in Treatment: 2 Vital Signs Time Taken: 13:10 Temperature (F): 97.7 Weight (lbs): 185 Pulse (bpm): 58 Respiratory Rate (breaths/min): 18 Blood Pressure (mmHg): 134/90 Reference Range: 80 - 120 mg / dl Electronic Signature(s) Signed: 07/10/2022 4:55:39 PM By: Angelina Pih Entered By: Angelina Pih on 07/10/2022 13:13:18

## 2022-07-24 ENCOUNTER — Encounter: Payer: 59 | Admitting: Physician Assistant

## 2022-07-24 DIAGNOSIS — L89323 Pressure ulcer of left buttock, stage 3: Secondary | ICD-10-CM | POA: Diagnosis not present
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
## 2022-07-25 NOTE — Progress Notes (Signed)
[name], [name] ([id number]) [id number] Page 1 of 7 Visit Report for 07/24/2022 Arrival Information Details Patient Name: Date of Service: [name] [facility] 07/24/2022 1:00 PM Medical Record Number: [id number] Patient Account Number: [id number] Date of Birth/Sex: Treating RN: Aug 23, 1985 (37 y.o. [name] Primary Care [name]: Unknown Foley Other Clinician: Referring Jammal Sarr: Treating Lekeya Rollings/Extender: Jacklyn Shell Weeks in Treatment: 4 Visit Information History Since Last Visit Added or deleted any medications: No Patient Arrived: Wheel Chair Any new allergies or adverse reactions: No Arrival Time: 13:02 Had a fall or experienced change in No Accompanied By: sister activities of daily living that may affect Transfer Assistance: None risk of falls: Patient Identification Verified: Yes Hospitalized since last visit: No Secondary Verification Process Completed: Yes Has Dressing in Place as Prescribed: Yes Pain Present Now: No Electronic Signature(s) Signed: 07/24/2022 5:10:54 PM By: Angelina Pih Entered By: Angelina Pih on 07/24/2022 13:03:02 -------------------------------------------------------------------------------- Clinic Level of Care Assessment Details Patient Name: Date of Service: [name], [name] [facility] 07/24/2022 1:00 PM Medical Record Number: [id number] Patient Account Number: [id number] Date of Birth/Sex: Treating RN: 06-20-1985 (37 y.o. [name] Primary Care [name]: Unknown Foley Other Clinician: Referring [name]: Treating Porfirio Bollier/Extender: Jacklyn Shell Weeks in Treatment: 4 Clinic Level of Care Assessment Items TOOL 1 Quantity Score  - 0 Use when EandM and Procedure is performed on INITIAL visit ASSESSMENTS - Nursing Assessment / Reassessment  - 0 General Physical Exam (combine w/ comprehensive assessment (listed just below) when performed on new pt. evals)  - 0 Comprehensive  Assessment (HX, ROS, Risk Assessments, Wounds Hx, etc.) ASSESSMENTS - Wound and Skin Assessment / Reassessment  - 0 Dermatologic / Skin Assessment (not related to wound area) ASSESSMENTS - Ostomy and/or Continence Assessment and Care  - 0 Incontinence Assessment and Management  - 0 Ostomy Care Assessment and Management (repouching, etc.) PROCESS - Coordination of Care  - 0 Simple Patient / Family Education for ongoing care  - 0 Complex (extensive) Patient / Family Education for ongoing care  - 0 Staff obtains Chiropractor, Records, T Results / Process Orders est  - 0 Staff telephones HHA, Nursing Homes / Clarify orders / etc  - 0 Routine Transfer to another Facility (non-emergent condition)  - 0 Routine Hospital Admission (non-emergent condition)  - 0 New Admissions / Insurance Authorizations / Ordering NPWT Apligraf, etc. ,  - 0 Emergency Hospital Admission (emergent condition) PROCESS - Special Needs [name] ([id number]) [id number] Page 2 of 7  - 0 Pediatric / Minor Patient Management  - 0 Isolation Patient Management  - 0 Hearing / Language / Visual special needs  - 0 Assessment of Community assistance (transportation, D/C planning, etc.)  - 0 Additional assistance / Altered mentation  - 0 Support Surface(s) Assessment (bed, cushion, seat, etc.) INTERVENTIONS - Miscellaneous  - 0 External ear exam  - 0 Patient Transfer (multiple staff / Nurse, adult / Similar devices)  - 0 Simple Staple / Suture removal (25 or less)  - 0 Complex Staple / Suture removal (26 or more)  - 0 Hypo/Hyperglycemic Management (do not check if billed separately)  - 0 Ankle / Brachial Index (ABI) - do not check if billed separately Has the patient been seen at the hospital within the last three years: Yes Total Score: 0 Level Of Care: ____ Electronic Signature(s) Signed: 07/24/2022 5:10:54 PM By: Angelina Pih Entered By: Angelina Pih on 07/24/2022 13:55:43 -------------------------------------------------------------------------------- Encounter Discharge Information Details Patient Name: Date of Service: [name], [name] [facility] 07/24/2022 1:00 PM  Medical Record Number: [id number] Patient Account Number: [id number] Date of Birth/Sex: Treating RN: 1986-03-11 (37 y.o. [name] Primary Care [name]: Unknown Foley Other Clinician: Referring [name]: Treating [name]/Extender: Jacklyn Shell Weeks in Treatment: 4 Encounter Discharge Information Items Post Procedure Vitals Discharge Condition: Stable Temperature (F): 97.7 Ambulatory Status: Wheelchair Pulse (bpm): 50 Discharge Destination: Home Respiratory Rate (breaths/min): 18 Transportation: Private Auto Blood Pressure (mmHg): 145/97 Accompanied By: sister Schedule Follow-up Appointment: Yes Clinical Summary of Care: Electronic Signature(s) Signed: 07/24/2022 2:02:16 PM By: Angelina Pih Entered By: Angelina Pih on 07/24/2022 14:02:16 -------------------------------------------------------------------------------- Lower Extremity Assessment Details Patient Name: Date of Service: [name], [name] 07/24/2022 1:00 PM Medical Record Number: [id number] Patient Account Number: [id number] Date of Birth/Sex: Treating RN: 08-Jul-1985 (37 y.o. [name] Primary Care [name]: Unknown Foley Other Clinician: Referring [name]: Treating [name]/Extender: Jacklyn Shell Weeks in Treatment: 4 Electronic Signature(s) Signed: 07/24/2022 5:10:54 PM By: Angelina Pih Entered By: Angelina Pih on 07/24/2022 13:13:02 [name] ([id number]) [id number] Page 3 of 7 -------------------------------------------------------------------------------- Multi Wound Chart Details Patient Name: Date of Service: [name], [name] [facility] 07/24/2022 1:00 PM Medical Record Number:  [id number] Patient Account Number: [id number] Date of Birth/Sex: Treating RN: Mar 04, 1986 (37 y.o. [name] Primary Care Arzell Mcgeehan: Unknown Foley Other Clinician: Referring Jamayah Myszka: Treating Dalante Minus/Extender: Jacklyn Shell Weeks in Treatment: 4 Vital Signs Height(in): Pulse(bpm): 50 Weight(lbs): 185 Blood Pressure(mmHg): 145/97 Body Mass Index(BMI): Temperature(F): 97.7 Respiratory Rate(breaths/min): 18 [8:Photos: No Photos Left Gluteus Wound Location: Pressure Injury Wounding Event: Pressure Ulcer Primary Etiology: Hypotension, Quadriplegia Comorbid History: 06/06/2022 Date A cquired: 4 Weeks of Treatment: Open Wound Status: No Wound Recurrence: 3.7x1.7x0.1  Measurements L x W x D (cm) 4.94 A (cm) : rea 0.494 Volume (cm) : 60.70% % Reduction in A rea: 60.70% % Reduction in Volume: Category/Stage III Classification: Medium Exudate A mount: Serosanguineous Exudate Type: red, brown Exudate Color: Epibole  Wound Margin: Medium (34-66%) Granulation A mount: Red Granulation Quality: Medium (34-66%) Necrotic A mount: Fat Layer (Subcutaneous Tissue): Yes N/A Exposed Structures: None Epithelialization: Debridement - Excisional Debridement: 13:38 Pre-procedure  Verification/Time Out Taken: Lidocaine 4% Topical Solution Pain Control: Subcutaneous, Slough Tissue Debrided: Skin/Subcutaneous Tissue Level: 6.29 Debridement A (sq cm): rea Curette Instrument: Minimum Bleeding: Pressure Hemostasis A chieved: Procedure  was tolerated well Debridement Treatment Response: 3.7x1.7x0.1 Post Debridement Measurements L x W x D (cm) 0.494 Post Debridement Volume: (cm) Category/Stage III Post Debridement Stage: Debridement Procedures Performed:] [N/A:N/A N/A N/A N/A N/A N/A  N/A N/A N/A N/A N/A N/A N/A N/A N/A N/A N/A N/A N/A N/A N/A N/A N/A N/A N/A N/A N/A N/A N/A N/A N/A N/A N/A N/A N/A N/A N/A] Treatment Notes Electronic Signature(s) Signed: 07/24/2022 1:55:18 PM By: Angelina Pih Entered By: Angelina Pih on 07/24/2022 13:55:18 -------------------------------------------------------------------------------- Multi-Disciplinary Care Plan Details Patient Name: Date of Service: [name], [name] [facility] 07/24/2022 1:00 PM Medical Record Number: [id number] Patient Account Number: [id number] Date of Birth/Sex: Treating RN: 1986-02-16 (37 y.o. [name] Primary Care Naje Rice: Unknown Foley Other Clinician: Referring Cambrey Lupi: Treating Colleena Kurtenbach/Extender: Philbert Riser in Treatment: 4 [place], [place] ([id number]) [id number] Page 4 of 7 Active Inactive Necrotic Tissue Nursing Diagnoses: Impaired tissue integrity related to necrotic/devitalized tissue Goals: Necrotic/devitalized tissue will be minimized in the wound bed Date Initiated: 07/10/2022 Target Resolution Date: 08/05/2022 Goal Status: Active Patient/caregiver will verbalize understanding of reason and process for debridement of necrotic tissue Date Initiated: 07/10/2022 Date Inactivated: 07/10/2022 Target Resolution Date: 07/10/2022 Goal Status: Met Interventions: Assess  patient pain level pre-, during and post procedure and prior to discharge Provide education on necrotic tissue and debridement process Notes: Wound/Skin Impairment Nursing Diagnoses: Impaired tissue integrity Knowledge deficit related to ulceration/compromised skin integrity Goals: Ulcer/skin breakdown will have a volume reduction of 30% by week 4 Date Initiated: 06/26/2022 Date Inactivated: 07/24/2022 Target Resolution Date: 07/24/2022 Goal Status: Met Ulcer/skin breakdown will have a volume reduction of 50% by week 8 Date Initiated: 06/26/2022 Target Resolution Date: 08/21/2022 Goal Status: Active Ulcer/skin breakdown will have a volume reduction of 80% by week 12 Date Initiated: 06/26/2022 Target Resolution Date: 09/18/2022 Goal Status: Active Ulcer/skin breakdown will heal within 14  weeks Date Initiated: 06/26/2022 Target Resolution Date: 10/02/2022 Goal Status: Active Interventions: Assess patient/caregiver ability to obtain necessary supplies Assess patient/caregiver ability to perform ulcer/skin care regimen upon admission and as needed Assess ulceration(s) every visit Provide education on ulcer and skin care Treatment Activities: Referred to DME Johnatan Baskette for dressing supplies : 06/26/2022 Skin care regimen initiated : 06/26/2022 Notes: Electronic Signature(s) Signed: 07/24/2022 2:00:32 PM By: Angelina Pih Entered By: Angelina Pih on 07/24/2022 14:00:32 -------------------------------------------------------------------------------- Pain Assessment Details Patient Name: Date of Service: [name], [name] 07/24/2022 1:00 PM Medical Record Number: [id number] Patient Account Number: [id number] Date of Birth/Sex: Treating RN: 09-21-1985 (37 y.o. [name] Primary Care Atiyana Welte: Unknown Foley Other Clinician: Referring Mardell Cragg: Treating Perri Aragones/Extender: Jacklyn Shell Weeks in Treatment: 4 Active Problems Location of Pain Severity and Description of Pain Patient Has Paino No [name], [name] ([id number]) [id number] Page 5 of 7 Patient Has Paino No Site Locations Rate the pain. Current Pain Level: 0 Pain Management and Medication Current Pain Management: Electronic Signature(s) Signed: 07/24/2022 5:10:54 PM By: Angelina Pih Entered By: Angelina Pih on 07/24/2022 13:04:33 -------------------------------------------------------------------------------- Patient/Caregiver Education Details Patient Name: Date of Service: [name], [name] 4/18/2024andnbsp1:00 PM Medical Record Number: [id number] Patient Account Number: [id number] Date of Birth/Gender: Treating RN: 1985/09/19 (37 y.o. [name] Primary Care Physician: Unknown Foley Other Clinician: Referring Physician: Treating Physician/Extender:  Philbert Riser in Treatment: 4 Education Assessment Education Provided To: Patient Education Topics Provided Pressure: Handouts: Other: cushion progress Methods: Explain/Verbal Responses: State content correctly Wound Debridement: Handouts: Wound Debridement Methods: Explain/Verbal Responses: State content correctly Wound/Skin Impairment: Handouts: Caring for Your Ulcer Methods: Explain/Verbal Responses: State content correctly Electronic Signature(s) Signed: 07/24/2022 5:10:54 PM By: Angelina Pih Entered By: Angelina Pih on 07/24/2022 14:01:21 -------------------------------------------------------------------------------- Wound Assessment Details Patient Name: Date of Service: [name], [name] [facility] 07/24/2022 1:00 PM [name] ([id number]) [id number] Page 6 of 7 Medical Record Number: [id number] Patient Account Number: [id number] Date of Birth/Sex: Treating RN: 1985-11-14 (37 y.o. [name] Primary Care Adonai Helzer: Unknown Foley Other Clinician: Referring Merlinda Wrubel: Treating Wendel Homeyer/Extender: Jacklyn Shell Weeks in Treatment: 4 Wound Status Wound Number: 8 Primary Pressure Ulcer Etiology: Wound Location: Left Gluteus Wound Open Wounding Event: Pressure Injury Status: Date Acquired: 06/06/2022 Notes: per family member wound has been there but has been small and Weeks Of Treatment: 4 not bad but after an episode of being in chair for a long time Clustered Wound: No Comorbid Hypotension, Quadriplegia History: Photos Wound Measurements Length: (cm) 3.7 Width: (cm) 1.7 Depth: (cm) 0.1 Area: (cm) 4.94 Volume: (cm) 0.494 % Reduction in Area: 60.7% % Reduction in Volume: 60.7% Epithelialization: None Tunneling: No Undermining: No Wound Description Classification: Category/Stage III Wound Margin: Epibole Exudate Amount: Medium Exudate Type: Serosanguineous Exudate Color: red, brown Foul Odor  After Cleansing: No Slough/Fibrino Yes Wound Bed Granulation Amount: Medium (  34-66%) Exposed Structure Granulation Quality: Red Fat Layer (Subcutaneous Tissue) Exposed: Yes Necrotic Amount: Medium (34-66%) Necrotic Quality: Adherent Slough Treatment Notes Wound #8 (Gluteus) Wound Laterality: Left Cleanser Byram Ancillary Kit - 15 Day Supply Discharge Instruction: Use supplies as instructed; Kit contains: (15) Saline Bullets; (15) 3x3 Gauze; 15 pr Gloves Soap and Water Discharge Instruction: Gently cleanse wound with antibacterial soap, rinse and pat dry prior to dressing wounds Vashe 5.8 (oz) Discharge Instruction: Use vashe 5.8 (oz) as directed Peri-Wound Care Topical Primary Dressing Hydrofera Blue Ready Transfer Foam, 2.5x2.5 (in/in) Discharge Instruction: Apply Hydrofera Blue Ready to wound bed as directed Secondary Dressing (BORDER) Zetuvit Plus SILICONE BORDER Dressing 5x5 (in/in) Discharge Instruction: Please do not put silicone bordered dressings under wraps. Use non-bordered dressing only. [name], [name] ([id number]) [id number] Page 7 of 7 Secured With Compression Wrap Compression Stockings Add-Ons Electronic Signature(s) Signed: 07/24/2022 5:10:54 PM By: Angelina Pih Entered By: Angelina Pih on 07/24/2022 14:03:52 -------------------------------------------------------------------------------- Vitals Details Patient Name: Date of Service: [name], [name] [facility] 07/24/2022 1:00 PM Medical Record Number: [id number] Patient Account Number: [id number] Date of Birth/Sex: Treating RN: 02/06/1986 (36 y.o. [name] Primary Care Vinia Jemmott: Unknown Foley Other Clinician: Referring Brookelynn Hamor: Treating Lajoya Dombek/Extender: Jacklyn Shell Weeks in Treatment: 4 Vital Signs Time Taken: 13:00 Temperature (F): 97.7 Weight (lbs): 185 Pulse (bpm): 50 Respiratory Rate (breaths/min): 18 Blood Pressure (mmHg): 145/97 Reference Range: 80  - 120 mg / dl Electronic Signature(s) Signed: 07/24/2022 5:10:54 PM By: Angelina Pih Entered By: Angelina Pih on 07/24/2022 13:04:17

## 2022-08-07 ENCOUNTER — Encounter: Payer: 59 | Attending: Physician Assistant | Admitting: Physician Assistant

## 2022-08-07 DIAGNOSIS — G8222 Paraplegia, incomplete: Secondary | ICD-10-CM | POA: Diagnosis not present

## 2022-08-07 DIAGNOSIS — L988 Other specified disorders of the skin and subcutaneous tissue: Secondary | ICD-10-CM | POA: Insufficient documentation

## 2022-08-07 DIAGNOSIS — L89323 Pressure ulcer of left buttock, stage 3: Secondary | ICD-10-CM | POA: Insufficient documentation

## 2022-08-07 DIAGNOSIS — B354 Tinea corporis: Secondary | ICD-10-CM | POA: Insufficient documentation

## 2022-08-08 NOTE — Progress Notes (Addendum)
[name], [name] ([id number]) [id number] Page 1 of 8 Visit Report for 08/07/2022 Arrival Information Details Patient Name: Date of Service: [name], [name] [facility] 08/07/2022 1:00 PM Medical Record Number: [id number] Patient Account Number: [id number] Date of Birth/Sex: Treating RN: [birth year] (37 y.o. [name] Primary Care [name] Other Clinician: Referring [name]: Treating [name]/Extender: [name] in Treatment: 6 Visit Information History Since Last Visit Added or deleted any medications: No Patient Arrived: Wheel Chair Any new allergies or adverse reactions: No Arrival Time: 13:01 Had a fall or experienced change in No Accompanied By: family activities of daily living that may affect Transfer Assistance: None risk of falls: Patient Identification Verified: Yes Hospitalized since last visit: No Secondary Verification Process Completed: Yes Has Dressing in Place as Prescribed: Yes Pain Present Now: No Electronic Signature(s) Signed: 08/07/2022 4:58:56 PM By: [name] Entered By: [name] on 08/07/2022 13:05:00 -------------------------------------------------------------------------------- Clinic Level of Care Assessment Details Patient Name: Date of Service: [name] 08/07/2022 1:00 PM Medical Record Number: [id number] Patient Account Number: [id number] Date of Birth/Sex: Treating RN: 05-02-1985 (37 y.o. [name] Primary Care Orrin Yurkovich: Unknown [name] Other Clinician: Referring [name]: Treating [name]/Extender: [name] in Treatment: 6 Clinic Level of Care Assessment Items TOOL 4 Quantity Score []  - 0 Use when only an EandM is performed on FOLLOW-UP visit ASSESSMENTS - Nursing Assessment / Reassessment X- 1 10 Reassessment of Co-morbidities (includes updates in patient status) X- 1 5 Reassessment of Adherence to Treatment Plan ASSESSMENTS - Wound  and Skin A ssessment / Reassessment []  - 0 Simple Wound Assessment / Reassessment - one wound X- 2 5 Complex Wound Assessment / Reassessment - multiple wounds []  - 0 Dermatologic / Skin Assessment (not related to wound area) ASSESSMENTS - Focused Assessment []  - 0 Circumferential Edema Measurements - multi extremities []  - 0 Nutritional Assessment / Counseling / Intervention []  - 0 Lower Extremity Assessment (monofilament, tuning fork, pulses) []  - 0 Peripheral Arterial Disease Assessment (using hand held doppler) ASSESSMENTS - Ostomy and/or Continence Assessment and Care []  - 0 Incontinence Assessment and Management []  - 0 Ostomy Care Assessment and Management (repouching, etc.) PROCESS - Coordination of Care X - Simple Patient / Family Education for ongoing care 1 15 []  - 0 Complex (extensive) Patient / Family Education for ongoing care [name] ([id number]) [id number] Page 2 of 8 X- 1 10 Staff obtains Consents, Records, T Results / Process Orders est []  - 0 Staff telephones HHA, Nursing Homes / Clarify orders / etc []  - 0 Routine Transfer to another Facility (non-emergent condition) []  - 0 Routine Hospital Admission (non-emergent condition) []  - 0 New Admissions / Manufacturing engineer / Ordering NPWT Apligraf, etc. , []  - 0 Emergency Hospital Admission (emergent condition) X- 1 10 Simple Discharge Coordination []  - 0 Complex (extensive) Discharge Coordination PROCESS - Special Needs []  - 0 Pediatric / Minor Patient Management []  - 0 Isolation Patient Management []  - 0 Hearing / Language / Visual special needs []  - 0 Assessment of Community assistance (transportation, D/C planning, etc.) []  - 0 Additional assistance / Altered mentation []  - 0 Support Surface(s) Assessment (bed, cushion, seat, etc.) INTERVENTIONS - Wound Cleansing / Measurement []  - 0 Simple Wound Cleansing - one wound X- 2 5 Complex Wound Cleansing -  multiple wounds X- 1 5 Wound Imaging (photographs - any number of wounds) []  - 0 Wound Tracing (instead of photographs) []  - 0 Simple Wound Measurement - one wound X- 2  5 Complex Wound Measurement - multiple wounds INTERVENTIONS - Wound Dressings X - Small Wound Dressing one or multiple wounds 2 10 []  - 0 Medium Wound Dressing one or multiple wounds []  - 0 Large Wound Dressing one or multiple wounds X- 1 5 Application of Medications - topical []  - 0 Application of Medications - injection INTERVENTIONS - Miscellaneous []  - 0 External ear exam []  - 0 Specimen Collection (cultures, biopsies, blood, body fluids, etc.) []  - 0 Specimen(s) / Culture(s) sent or taken to Lab for analysis []  - 0 Patient Transfer (multiple staff / Nurse, adult / Similar devices) []  - 0 Simple Staple / Suture removal (25 or less) []  - 0 Complex Staple / Suture removal (26 or more) []  - 0 Hypo / Hyperglycemic Management (close monitor of Blood Glucose) []  - 0 Ankle / Brachial Index (ABI) - do not check if billed separately X- 1 5 Vital Signs Has the patient been seen at the hospital within the last three years: Yes Total Score: 115 Level Of Care: New/Established - Level 3 Electronic Signature(s) Signed: 08/07/2022 4:58:56 PM By: [name] Entered By: [name] on 08/07/2022 13:34:33 [name] ([id number]) [id number] Page 3 of 8 -------------------------------------------------------------------------------- Encounter Discharge Information Details Patient Name: Date of Service: [name], [name] [facility] 08/07/2022 1:00 PM Medical Record Number: [id number] Patient Account Number: [id number] Date of Birth/Sex: Treating RN: Mar 24, 1986 (37 y.o. [name] Primary Care Ramsey Guadamuz: Unknown [name] Other Clinician: Referring Marisol [name]: Treating Rosabella Edgin/Extender: [name] in Treatment: 6 Encounter Discharge Information Items Discharge Condition:  Stable Ambulatory Status: Wheelchair Discharge Destination: Home Transportation: Private Auto Accompanied By: sister Schedule Follow-up Appointment: Yes Clinical Summary of Care: Electronic Signature(s) Signed: 08/07/2022 4:58:56 PM By: [name] Entered By: [name] on 08/07/2022 13:36:09 -------------------------------------------------------------------------------- Lower Extremity Assessment Details Patient Name: Date of Service: [name], [name] 08/07/2022 1:00 PM Medical Record Number: [id number] Patient Account Number: [id number] Date of Birth/Sex: Treating RN: Jan 18, 1986 (37 y.o. [name] Primary Care Berl Bonfanti: Unknown [name] Other Clinician: Referring Damarien Nyman: Treating Laquinda Moller/Extender: [name] in Treatment: 6 Electronic Signature(s) Signed: 08/07/2022 4:58:56 PM By: [name] Entered By: [name] on 08/07/2022 13:23:07 -------------------------------------------------------------------------------- Multi Wound Chart Details Patient Name: Date of Service: [name], [name] 08/07/2022 1:00 PM Medical Record Number: [id number] Patient Account Number: [id number] Date of Birth/Sex: Treating RN: 1985-08-16 (37 y.o. [name] Primary Care Keri Tavella: Unknown [name] Other Clinician: Referring Rupinder Livingston: Treating Kaoru Benda/Extender: [name] in Treatment: 6 Vital Signs Height(in): Pulse(bpm): 75 Weight(lbs): 185 Blood Pressure(mmHg): 108/72 Body Mass Index(BMI): Temperature(F): 98.4 Respiratory Rate(breaths/min): 18 [8:Photos:] [N/A:N/A] Left Gluteus N/A N/A Wound Location: Pressure Injury N/A N/A Wounding Event: Pressure Ulcer N/A N/A Primary EtiologyTILAK, BRIETZKE ([id number]) [id number] Page 4 of 8 Hypotension, Quadriplegia N/A N/A Comorbid History: 06/06/2022 N/A N/A Date Acquired: 6 N/A N/A [name] of Treatment: Open N/A N/A Wound Status: No N/A  N/A Wound Recurrence: 3x1.3x0.1 N/A N/A Measurements L x W x D (cm) 3.063 N/A N/A A (cm) : rea 0.306 N/A N/A Volume (cm) : 75.60% N/A N/A % Reduction in A rea: 75.70% N/A N/A % Reduction in Volume: Category/Stage III N/A N/A Classification: Medium N/A N/A Exudate A mount: Serosanguineous N/A N/A Exudate Type: red, brown N/A N/A Exudate Color: Epibole N/A N/A Wound Margin: Small (1-33%) N/A N/A Granulation A mount: Red N/A N/A Granulation Quality: Large (67-100%) N/A N/A Necrotic A mount: Fat Layer (Subcutaneous Tissue): Yes N/A N/A Exposed Structures: None N/A N/A Epithelialization: Treatment  Notes Electronic Signature(s) Signed: 08/07/2022 4:58:56 PM By: [name] Entered By: [name] on 08/07/2022 13:23:41 -------------------------------------------------------------------------------- Multi-Disciplinary Care Plan Details Patient Name: Date of Service: [name], [name] [facility] 08/07/2022 1:00 PM Medical Record Number: [id number] Patient Account Number: [id number] Date of Birth/Sex: Treating RN: 23-Apr-1985 (37 y.o. [name] Primary Care Jacky Dross: Unknown [name] Other Clinician: Referring Azion Centrella: Treating Yogi Arther/Extender: [name] in Treatment: 6 Active Inactive Wound/Skin Impairment Nursing Diagnoses: Impaired tissue integrity Knowledge deficit related to ulceration/compromised skin integrity Goals: Ulcer/skin breakdown will have a volume reduction of 30% by week 4 Date Initiated: 06/26/2022 Date Inactivated: 07/24/2022 Target Resolution Date: 07/24/2022 Goal Status: Met Ulcer/skin breakdown will have a volume reduction of 50% by week 8 Date Initiated: 06/26/2022 Target Resolution Date: 08/21/2022 Goal Status: Active Ulcer/skin breakdown will have a volume reduction of 80% by week 12 Date Initiated: 06/26/2022 Target Resolution Date: 09/18/2022 Goal Status: Active Ulcer/skin breakdown will heal within 14 [name] Date  Initiated: 06/26/2022 Target Resolution Date: 10/02/2022 Goal Status: Active Interventions: Assess patient/caregiver ability to obtain necessary supplies Assess patient/caregiver ability to perform ulcer/skin care regimen upon admission and as needed Assess ulceration(s) every visit Provide education on ulcer and skin care Treatment Activities: Referred to DME Mikella Linsley for dressing supplies : 06/26/2022 Skin care regimen initiated : 06/26/2022 Notes: KEELON, BUEHL ([id number]) [id number] Page 5 of 8 Electronic Signature(s) Signed: 08/07/2022 4:58:56 PM By: [name] Entered By: [name] on 08/07/2022 13:35:13 -------------------------------------------------------------------------------- Pain Assessment Details Patient Name: Date of Service: [name], [name] 08/07/2022 1:00 PM Medical Record Number: [id number] Patient Account Number: [id number] Date of Birth/Sex: Treating RN: 12/24/1985 (37 y.o. [name] Primary Care Lexi Conaty: Unknown [name] Other Clinician: Referring Mckynzi Cammon: Treating Shanetha Bradham/Extender: [name] in Treatment: 6 Active Problems Location of Pain Severity and Description of Pain Patient Has Paino No Site Locations Rate the pain. Current Pain Level: 0 Pain Management and Medication Current Pain Management: Electronic Signature(s) Signed: 08/07/2022 4:58:56 PM By: [name] Entered By: [name] on 08/07/2022 13:07:30 -------------------------------------------------------------------------------- Patient/Caregiver Education Details Patient Name: Date of Service: [name], [name] 5/2/2024andnbsp1:00 PM Medical Record Number: [id number] Patient Account Number: [id number] Date of Birth/Gender: Treating RN: 04-14-1985 (37 y.o. [name] Primary Care Physician: Unknown [name] Other Clinician: Referring Physician: Treating Physician/Extender: [name] in  Treatment: 6 Education Assessment Education Provided To: Patient Education Topics Provided Pressure: Handouts: Other: wheelchair mattress issue discussed Methods: Explain/Verbal Responses: State content correctly Wound/Skin Impairment: Handouts: Caring for Your Ulcer GNAGEY, Thereasa [name] ([id number]) [id number] Page 6 of 8 Methods: Explain/Verbal Responses: State content correctly Electronic Signature(s) Signed: 08/07/2022 4:58:56 PM By: [name] Entered By: [name] on 08/07/2022 13:35:39 -------------------------------------------------------------------------------- Wound Assessment Details Patient Name: Date of Service: [name], [name] 08/07/2022 1:00 PM Medical Record Number: [id number] Patient Account Number: [id number] Date of Birth/Sex: Treating RN: [birth year] (37 y.o. [name] Primary Care Gurpreet Mikhail: Unknown [name] Other Clinician: Referring Sameer Teeple: Treating Renato Spellman/Extender: [name] in Treatment: 6 Wound Status Wound Number: 8 Primary Pressure Ulcer Etiology: Wound Location: Left Gluteus Wound Open Wounding Event: Pressure Injury Status: Date Acquired: 06/06/2022 Notes: per family member wound has been there but has been small and [name] Of Treatment: 6 not bad but after an episode of being in chair for a long time Clustered Wound: No Comorbid Hypotension, Quadriplegia History: Photos Wound Measurements Length: (cm) 3 Width: (cm) 1.3 Depth: (cm) 0.1 Area: (cm) 3.063 Volume: (cm) 0.306 % Reduction in Area: 75.6% % Reduction in  Volume: 75.7% Epithelialization: None Tunneling: No Undermining: No Wound Description Classification: Category/Stage III Wound Margin: Epibole Exudate Amount: Medium Exudate Type: Serosanguineous Exudate Color: red, brown Foul Odor After Cleansing: No Slough/Fibrino Yes Wound Bed Granulation Amount: Small (1-33%) Exposed Structure Granulation Quality:  Red Fat Layer (Subcutaneous Tissue) Exposed: Yes Necrotic Amount: Large (67-100%) Necrotic Quality: Adherent Slough Treatment Notes Wound #8 (Gluteus) Wound Laterality: Left Cleanser Byram Ancillary Kit - 15 Day Supply Discharge Instruction: Use supplies as instructed; Kit contains: (15) Saline Bullets; (15) 3x3 Gauze; 15 pr Gloves Soap and Water Discharge Instruction: Gently cleanse wound with antibacterial soap, rinse and pat dry prior to dressing wounds Tompson, Thereasa [name] ([id number]) [id number] Page 7 of 8 Vashe 5.8 (oz) Discharge Instruction: Use vashe 5.8 (oz) as directed Peri-Wound Care Topical Primary Dressing Hydrofera Blue Ready Transfer Foam, 2.5x2.5 (in/in) Discharge Instruction: Apply Hydrofera Blue Ready to wound bed as directed Secondary Dressing (BORDER) Zetuvit Plus SILICONE BORDER Dressing 5x5 (in/in) Discharge Instruction: Please do not put silicone bordered dressings under wraps. Use non-bordered dressing only. Secured With Compression Wrap Compression Stockings Facilities manager) Signed: 08/07/2022 4:58:56 PM By: [name] Entered By: [name] on 08/07/2022 13:17:22 -------------------------------------------------------------------------------- Wound Assessment Details Patient Name: Date of Service: [name], [name] 08/07/2022 1:00 PM Medical Record Number: [id number] Patient Account Number: [id number] Date of Birth/Sex: Treating RN: Sep 03, 1985 (37 y.o. [name] Primary Care Claudy Abdallah: Unknown [name] Other Clinician: Referring Kincade Granberg: Treating Keeanna Villafranca/Extender: [name] in Treatment: 6 Wound Status Wound Number: 9 Primary Etiology: Skin Tear Wound Location: Left Lower Leg Wound Status: Open Wounding Event: Skin Tear/Laceration Comorbid History: Hypotension, Quadriplegia Date Acquired: 08/07/2022 [name] Of Treatment: 0 Clustered Wound: No Photos Wound Measurements Length:  (cm) 1 Width: (cm) 1.5 Depth: (cm) 0.1 Area: (cm) 1.178 Volume: (cm) 0.118 % Reduction in Area: % Reduction in Volume: Epithelialization: None Tunneling: No Undermining: No Wound Description Classification: Full Thickness Without Exposed Support Structures Exudate Amount: Medium Exudate Type: Serosanguineous Exudate Color: red, brown Brugh, Munachimso ([id number]) Wound Bed Granulation Amount: Large (67-100%) Granulation Quality: Red Necrotic Amount: None Present (0%) Foul Odor After Cleansing: No Slough/Fibrino No [id number] Page 8 of 8 Exposed Structure Fat Layer (Subcutaneous Tissue) Exposed: Yes Treatment Notes Wound #9 (Lower Leg) Wound Laterality: Left Cleanser Byram Ancillary Kit - 15 Day Supply Discharge Instruction: Use supplies as instructed; Kit contains: (15) Saline Bullets; (15) 3x3 Gauze; 15 pr Gloves Soap and Water Discharge Instruction: Gently cleanse wound with antibacterial soap, rinse and pat dry prior to dressing wounds Vashe 5.8 (oz) Discharge Instruction: Use vashe 5.8 (oz) as directed Peri-Wound Care Topical Primary Dressing Hydrofera Blue Ready Transfer Foam, 2.5x2.5 (in/in) Discharge Instruction: Apply Hydrofera Blue Ready to wound bed as directed Secondary Dressing (BORDER) Zetuvit Plus SILICONE BORDER Dressing 5x5 (in/in) Discharge Instruction: Please do not put silicone bordered dressings under wraps. Use non-bordered dressing only. Secured With Compression Wrap Compression Stockings Facilities manager) Signed: 08/07/2022 4:58:56 PM By: [name] Entered By: [name] on 08/07/2022 13:53:08 -------------------------------------------------------------------------------- Vitals Details Patient Name: Date of Service: [name], [name] [facility] 08/07/2022 1:00 PM Medical Record Number: [id number] Patient Account Number: [id number] Date of Birth/Sex: Treating RN: 12/09/1985 (36 y.o. [name] Primary Care Elody Kleinsasser: Unknown [name] Other Clinician: Referring Jaanvi Fizer: Treating Ahna Konkle/Extender: [name] in Treatment: 6 Vital Signs Time Taken: 13:05 Temperature (F): 98.4 Weight (lbs): 185 Pulse (bpm): 75 Respiratory Rate (breaths/min): 18 Blood Pressure (mmHg): 108/72 Reference Range: 80 - 120 mg / dl Electronic Signature(s) Signed: 08/07/2022  4:58:56 PM By: [name] Entered By: [name] on 08/07/2022 13:07:22

## 2022-08-08 NOTE — Progress Notes (Addendum)
[name], [name] ([id number]) [id number] Page 1 of 7 Visit Report for 08/07/2022 Chief Complaint Document Details Patient Name: Date of Service: [name], [name] [facility] 08/07/2022 1:00 PM Medical Record Number: [id number] Patient Account Number: [id number] Date of Birth/Sex: Treating RN: Apr 10, 1985 (37 y.o. [name] Primary Care Provider: Unknown Foley Other Clinician: Referring Provider: Treating Provider/Extender: Jacklyn Shell Weeks in Treatment: 6 Information Obtained from: Patient Chief Complaint Left buttock ulcer Electronic Signature(s) Signed: 08/07/2022 1:04:20 PM By: Allen Derry PA-C Entered By: Allen Derry on 08/07/2022 13:04:19 -------------------------------------------------------------------------------- HPI Details Patient Name: Date of Service: [name] 08/07/2022 1:00 PM Medical Record Number: [id number] Patient Account Number: [id number] Date of Birth/Sex: Treating RN: [birth year] (37 y.o. [name] Primary Care Provider: Unknown Foley Other Clinician: Referring Provider: Treating Provider/Extender: Jacklyn Shell Weeks in Treatment: 6 History of Present Illness HPI Description: 03/18/2021 upon evaluation today patient presents for initial inspection here in the clinic concerning issues that he has been having with his back. Fortunately this seems to be related to multiple potential issues here which involve moisture, pressure, and I believe a fungal infection as well. In the past he seems to have responded to steroids topically. With that being said I am thinking that might still be something that could be helpful for him. I am going to see about setting this and to the pharmacy to get things started. I also think we may want to do a wound culture in order to evaluate for any possibilities there. 04/19/2021 upon evaluation today patient actually appears to be doing excellent in regard to his back. He is  actually making wonderful progress as far as this is concerned. I do not see any signs of infection at this time which is great news and overall I think that he is doing quite well. Admission 10/30/2021 Mr. [name] is a 37 year old male with a past medical history of paraplegia that presents with extensive wounds to his back and left leg. He was seen in our clinic earlier in the year for a similar presentation but limited to the back area. This was treated effectively with antifungal spray, triamcinolone cream and oral antibiotics. He again presents today with similar wounds to his back. Unfortunately he has also developed wounds to his left lower extremity. He states that the wounds started after he was casted for a broken leg. He has also developed wounds to the left upper thigh. He states he was sunburned to this area and subsequently developed wounds. He has currently been keeping the wounds covered. He denies systemic signs of infection. 8/2; patient presents for follow-up. Has been using triamcinolone cream with antifungal spray on the wound beds. He has been taking Augmentin without issues. He reports improvement in wound healing. 8/16; patient presents for follow-up. He continues to use triamcinolone cream with antifungal spray to the wound beds. He has completed his course of Augmentin. He continues to report improvement in wound healing. He has no issues or complaints today. 9/13; patient presents for follow-up. He continues to use triamcinolone cream with antifungal spray with benefit to the wound healing. He developed a small skin tear to the right lower leg today by hitting this against an object. He has no issues or complaints today. He has not heard from dermatology from referral placed at first clinic visit. 10/11; patient presents for follow-up. He continues to use triamcinolone cream with antifungal spray to the wound beds with benefit in wound healing. He is scheduled to see  dermatology in February. He has no issues or complaints today. 11/22; patient presents for follow-up. He continues to use triamcinolone cream with antifungal spray to the wound beds. The left lower extremity and upper thigh wounds have healed. His remaining wounds are on his back. He is scheduled to see dermatology in February. He denies signs of infection. Readmission: 06-26-2022 patient presents today for reevaluation here in the clinic although previously he was seen due to an issue with a significant rash over his continuous back and gluteal region. This is actually being managed by dermatology he is actually seen today for completely separate issue. His issue today is that of having a wound in the left gluteal location. This unfortunately occurred as a perfect storm of unfortunately his Roho cushion no longer holding air and him having set up in his chair for about 10 hours total unbeknownst to him and causing damage to the gluteal region and a pressure ulcer which is a stage III pressure ulceration at this point. This is quite significant and is going to require a bit of time to get this to heal I do believe. It is not as bad as it could have been but also not good. 07-10-2022 upon evaluation today patient appears to be doing well currently in regard to his wound. He does have an open area however that still does have quite [name], [name] ([id number]) [id number] Page 2 of 7 a bit of slough and biofilm noted on the surface of the wound. I am going to have to perform some debridement today he was a little nervous about this but nonetheless he did consent to it. I would describe that in the next section. 07-24-2022 upon evaluation today patient appears to be doing well currently in regard to his wound. He has been tolerating the dressing changes without complication. Fortunately there does not appear to be any signs of active infection locally nor systemically at this time  which is great news. No fevers, chills, nausea, vomiting, or diarrhea. 08-07-2022 upon evaluation today patient appears to be doing better in regard to his wound. He has been tolerating the dressing changes without complication. Fortunately there does not appear to be any signs of active infection locally nor systemically at this time which is great news. I do believe that he is making good progress here. Electronic Signature(s) Signed: 08/07/2022 1:32:46 PM By: Allen Derry PA-C Entered By: Allen Derry on 08/07/2022 13:32:46 -------------------------------------------------------------------------------- Physical Exam Details Patient Name: Date of Service: [name], [name] 08/07/2022 1:00 PM Medical Record Number: [id number] Patient Account Number: [id number] Date of Birth/Sex: Treating RN: 29-Jul-1985 (37 y.o. [name] Primary Care Provider: Unknown Foley Other Clinician: Referring Provider: Treating Provider/Extender: Jacklyn Shell Weeks in Treatment: 6 Constitutional Well-nourished and well-hydrated in no acute distress. Respiratory normal breathing without difficulty. Psychiatric this patient is able to make decisions and demonstrates good insight into disease process. Alert and Oriented x 3. pleasant and cooperative. Notes Upon inspection patient's wound bed actually showed signs of making progress he does need a new chair and his cushion still was not working properly they are using a pillow in place of this and it has been more or less they tell me at night and they are trying to get a new chair and working through this. With that being said they are lasting that the gentleman from the company told him is that the patient did not have a "qualifying diagnosis for wheelchair". Again he is a paraplegic and therefore this  makes absolutely no sense whatsoever. Electronic Signature(s) Signed: 08/07/2022 1:33:19 PM By: Allen Derry PA-C Entered By: Allen Derry on  08/07/2022 13:33:18 -------------------------------------------------------------------------------- Physician Orders Details Patient Name: Date of Service: [name], [name] [facility] 08/07/2022 1:00 PM Medical Record Number: [id number] Patient Account Number: [id number] Date of Birth/Sex: Treating RN: 1985-11-07 (37 y.o. [name] Primary Care Provider: Unknown Foley Other Clinician: Referring Provider: Treating Provider/Extender: Jacklyn Shell Weeks in Treatment: 6 Verbal / Phone Orders: No Diagnosis Coding ICD-10 Coding Code Description [phone] Pressure ulcer of left buttock, stage 3 G82.22 Paraplegia, incomplete B35.4 Tinea corporis L98.8 Other specified disorders of the skin and subcutaneous tissue Follow-up Appointments Return Appointment in 2 weeks. Home Health Home Health Company: - [facility] for wound care. May utilize formulary equivalent dressing for wound treatment orders unless otherwise specified. [name], [name] ([id number]) [id number] Page 3 of 7 Home Health Nurse may visit PRN to address patients wound care needs. Scheduled days for dressing changes to be completed; exception, patient has scheduled wound care visit that day. **Please direct any NON-WOUND related issues/requests for orders to patient's Primary Care Physician. **If current dressing causes regression in wound condition, may D/C ordered dressing product/s and apply Normal Saline Moist Dressing daily until next Wound Healing Center or Other MD appointment. **Notify Wound Healing Center of regression in wound condition at [phone]. Bathing/ Shower/ Hygiene May shower; gently cleanse wound with antibacterial soap, rinse and pat dry prior to dressing wounds No tub bath. Anesthetic (Use 'Patient Medications' Section for Anesthetic Order Entry) Lidocaine applied to wound bed Off-Loading Wound #8 Left Gluteus Roho cushion for wheelchair - one off  [employer] recommended until can get replacement. Turn and reposition every 2 hours Wound Treatment Wound #8 - Gluteus Wound Laterality: Left Cleanser: Byram Ancillary Kit - 15 Day Supply (Generic) 3 x Per Week/30 Days Discharge Instructions: Use supplies as instructed; Kit contains: (15) Saline Bullets; (15) 3x3 Gauze; 15 pr Gloves Cleanser: Soap and Water 3 x Per Week/30 Days Discharge Instructions: Gently cleanse wound with antibacterial soap, rinse and pat dry prior to dressing wounds Cleanser: Vashe 5.8 (oz) 3 x Per Week/30 Days Discharge Instructions: Use vashe 5.8 (oz) as directed Prim Dressing: Hydrofera Blue Ready Transfer Foam, 2.5x2.5 (in/in) (Generic) 3 x Per Week/30 Days ary Discharge Instructions: Apply Hydrofera Blue Ready to wound bed as directed Secondary Dressing: (BORDER) Zetuvit Plus SILICONE BORDER Dressing 5x5 (in/in) (Generic) 3 x Per Week/30 Days Discharge Instructions: Please do not put silicone bordered dressings under wraps. Use non-bordered dressing only. Wound #9 - Lower Leg Wound Laterality: Left Cleanser: Byram Ancillary Kit - 15 Day Supply (Generic) 3 x Per Week/30 Days Discharge Instructions: Use supplies as instructed; Kit contains: (15) Saline Bullets; (15) 3x3 Gauze; 15 pr Gloves Cleanser: Soap and Water 3 x Per Week/30 Days Discharge Instructions: Gently cleanse wound with antibacterial soap, rinse and pat dry prior to dressing wounds Cleanser: Vashe 5.8 (oz) 3 x Per Week/30 Days Discharge Instructions: Use vashe 5.8 (oz) as directed Prim Dressing: Hydrofera Blue Ready Transfer Foam, 2.5x2.5 (in/in) (Generic) 3 x Per Week/30 Days ary Discharge Instructions: Apply Hydrofera Blue Ready to wound bed as directed Secondary Dressing: (BORDER) Zetuvit Plus SILICONE BORDER Dressing 5x5 (in/in) (Generic) 3 x Per Week/30 Days Discharge Instructions: Please do not put silicone bordered dressings under wraps. Use non-bordered dressing only. Electronic  Signature(s) Signed: 08/07/2022 4:17:31 PM By: Allen Derry PA-C Signed: 08/07/2022 4:58:56 PM By: Angelina Pih Entered By: Angelina Pih on 08/07/2022 13:33:14 -------------------------------------------------------------------------------- Problem  List Details Patient Name: Date of Service: [name], [name] [facility] 08/07/2022 1:00 PM Medical Record Number: [id number] Patient Account Number: [id number] Date of Birth/Sex: Treating RN: 07-23-1985 (37 y.o. [name] Primary Care Provider: Unknown Foley Other Clinician: Referring Provider: Treating Provider/Extender: Jacklyn Shell Weeks in Treatment: 6 Active Problems ICD-10 Encounter Code Description Active Date MDM Diagnosis CARI, VOSHELL ([id number]) [id number] Page 4 of 7 [phone] Pressure ulcer of left buttock, stage 3 06/26/2022 No Yes G82.22 Paraplegia, incomplete 06/26/2022 No Yes B35.4 Tinea corporis 06/26/2022 No Yes L98.8 Other specified disorders of the skin and subcutaneous tissue 06/26/2022 No Yes Inactive Problems Resolved Problems Electronic Signature(s) Signed: 08/07/2022 1:04:14 PM By: Allen Derry PA-C Entered By: Allen Derry on 08/07/2022 13:04:14 -------------------------------------------------------------------------------- Progress Note Details Patient Name: Date of Service: [name], [name] 08/07/2022 1:00 PM Medical Record Number: [id number] Patient Account Number: [id number] Date of Birth/Sex: Treating RN: 1986-02-05 (37 y.o. [name] Primary Care Provider: Unknown Foley Other Clinician: Referring Provider: Treating Provider/Extender: Jacklyn Shell Weeks in Treatment: 6 Subjective Chief Complaint Information obtained from Patient Left buttock ulcer History of Present Illness (HPI) 03/18/2021 upon evaluation today patient presents for initial inspection here in the clinic concerning issues that he has been having with his back. Fortunately this  seems to be related to multiple potential issues here which involve moisture, pressure, and I believe a fungal infection as well. In the past he seems to have responded to steroids topically. With that being said I am thinking that might still be something that could be helpful for him. I am going to see about setting this and to the pharmacy to get things started. I also think we may want to do a wound culture in order to evaluate for any possibilities there. 04/19/2021 upon evaluation today patient actually appears to be doing excellent in regard to his back. He is actually making wonderful progress as far as this is concerned. I do not see any signs of infection at this time which is great news and overall I think that he is doing quite well. Admission 10/30/2021 Mr. [name] [name] is a 37 year old male with a past medical history of paraplegia that presents with extensive wounds to his back and left leg. He was seen in our clinic earlier in the year for a similar presentation but limited to the back area. This was treated effectively with antifungal spray, triamcinolone cream and oral antibiotics. He again presents today with similar wounds to his back. Unfortunately he has also developed wounds to his left lower extremity. He states that the wounds started after he was casted for a broken leg. He has also developed wounds to the left upper thigh. He states he was sunburned to this area and subsequently developed wounds. He has currently been keeping the wounds covered. He denies systemic signs of infection. 8/2; patient presents for follow-up. Has been using triamcinolone cream with antifungal spray on the wound beds. He has been taking Augmentin without issues. He reports improvement in wound healing. 8/16; patient presents for follow-up. He continues to use triamcinolone cream with antifungal spray to the wound beds. He has completed his course of Augmentin. He continues to report improvement in  wound healing. He has no issues or complaints today. 9/13; patient presents for follow-up. He continues to use triamcinolone cream with antifungal spray with benefit to the wound healing. He developed a small skin tear to the right lower leg today by hitting this against an object. He has no  issues or complaints today. He has not heard from dermatology from referral placed at first clinic visit. 10/11; patient presents for follow-up. He continues to use triamcinolone cream with antifungal spray to the wound beds with benefit in wound healing. He is scheduled to see dermatology in February. He has no issues or complaints today. 11/22; patient presents for follow-up. He continues to use triamcinolone cream with antifungal spray to the wound beds. The left lower extremity and upper thigh wounds have healed. His remaining wounds are on his back. He is scheduled to see dermatology in February. He denies signs of infection. Readmission: 06-26-2022 patient presents today for reevaluation here in the clinic although previously he was seen due to an issue with a significant rash over his continuous back and gluteal region. This is actually being managed by dermatology he is actually seen today for completely separate issue. His issue today is that of having a wound in the left gluteal location. This unfortunately occurred as a perfect storm of unfortunately his Roho cushion no longer holding air and him having set up in his chair for about 10 hours total unbeknownst to him and causing damage to the gluteal region and a pressure ulcer which is a [name], [name] ([id number]) [id number] Page 5 of 7 pressure ulceration at this point. This is quite significant and is going to require a bit of time to get this to heal I do believe. It is not as bad as it could have been but also not good. 07-10-2022 upon evaluation today patient appears to be doing well currently in regard to his  wound. He does have an open area however that still does have quite a bit of slough and biofilm noted on the surface of the wound. I am going to have to perform some debridement today he was a little nervous about this but nonetheless he did consent to it. I would describe that in the next section. 07-24-2022 upon evaluation today patient appears to be doing well currently in regard to his wound. He has been tolerating the dressing changes without complication. Fortunately there does not appear to be any signs of active infection locally nor systemically at this time which is great news. No fevers, chills, nausea, vomiting, or diarrhea. 08-07-2022 upon evaluation today patient appears to be doing better in regard to his wound. He has been tolerating the dressing changes without complication. Fortunately there does not appear to be any signs of active infection locally nor systemically at this time which is great news. I do believe that he is making good progress here. Objective Constitutional Well-nourished and well-hydrated in no acute distress. Vitals Time Taken: 1:05 PM, Weight: 185 lbs, Temperature: 98.4 F, Pulse: 75 bpm, Respiratory Rate: 18 breaths/min, Blood Pressure: 108/72 mmHg. Respiratory normal breathing without difficulty. Psychiatric this patient is able to make decisions and demonstrates good insight into disease process. Alert and Oriented x 3. pleasant and cooperative. General Notes: Upon inspection patient's wound bed actually showed signs of making progress he does need a new chair and his cushion still was not working properly they are using a pillow in place of this and it has been more or less they tell me at night and they are trying to get a new chair and working through this. With that being said they are lasting that the gentleman from the company told him is that the patient did not have a "qualifying diagnosis for wheelchair". Again he is a paraplegic and therefore this  makes absolutely no sense whatsoever. Integumentary (Hair, Skin) Wound #8 status is Open. Original cause of wound was Pressure Injury. The date acquired was: 06/06/2022. The wound has been in treatment 6 weeks. The wound is located on the Left Gluteus. The wound measures 3cm length x 1.3cm width x 0.1cm depth; 3.063cm^2 area and 0.306cm^3 volume. There is Fat Layer (Subcutaneous Tissue) exposed. There is no tunneling or undermining noted. There is a medium amount of serosanguineous drainage noted. The wound margin is epibole. There is small (1-33%) red granulation within the wound bed. There is a large (67-100%) amount of necrotic tissue within the wound bed including Adherent Slough. Wound #9 status is Open. Original cause of wound was Skin T ear/Laceration. The date acquired was: 08/07/2022. The wound is located on the Left Lower Leg. The wound measures 1cm length x 1.5cm width x 0.1cm depth; 1.178cm^2 area and 0.118cm^3 volume. There is Fat Layer (Subcutaneous Tissue) exposed. There is no tunneling or undermining noted. There is a medium amount of serosanguineous drainage noted. There is large (67-100%) red granulation within the wound bed. There is no necrotic tissue within the wound bed. Assessment Active Problems ICD-10 Pressure ulcer of left buttock, stage 3 Paraplegia, incomplete Tinea corporis Other specified disorders of the skin and subcutaneous tissue Plan Follow-up Appointments: Return Appointment in 2 weeks. Home Health: Home Health Company: - [facility] for wound care. May utilize formulary equivalent dressing for wound treatment orders unless otherwise specified. Home Health Nurse may visit PRN to address patientoos wound care needs. Scheduled days for dressing changes to be completed; exception, patient has scheduled wound care visit that day. **Please direct any NON-WOUND related issues/requests for orders to patient's Primary Care Physician. **If current  dressing causes regression in wound condition, may D/C ordered dressing product/s and apply Normal Saline Moist Dressing daily until next Wound Healing Center or Other MD appointment. **Notify Wound Healing Center of regression in wound condition at [phone]. Bathing/ Shower/ Hygiene: May shower; gently cleanse wound with antibacterial soap, rinse and pat dry prior to dressing wounds No tub bath. [name], [name] ([id number]) [id number] Page 6 of 7 Anesthetic (Use 'Patient Medications' Section for Anesthetic Order Entry): Lidocaine applied to wound bed Off-Loading: Wound #8 Left Gluteus: Roho cushion for wheelchair - one off [employer] recommended until can get replacement. Turn and reposition every 2 hours WOUND #8: - Gluteus Wound Laterality: Left Cleanser: Byram Ancillary Kit - 15 Day Supply (Generic) 3 x Per Week/30 Days Discharge Instructions: Use supplies as instructed; Kit contains: (15) Saline Bullets; (15) 3x3 Gauze; 15 pr Gloves Cleanser: Soap and Water 3 x Per Week/30 Days Discharge Instructions: Gently cleanse wound with antibacterial soap, rinse and pat dry prior to dressing wounds Cleanser: Vashe 5.8 (oz) 3 x Per Week/30 Days Discharge Instructions: Use vashe 5.8 (oz) as directed Prim Dressing: Hydrofera Blue Ready Transfer Foam, 2.5x2.5 (in/in) (Generic) 3 x Per Week/30 Days ary Discharge Instructions: Apply Hydrofera Blue Ready to wound bed as directed Secondary Dressing: (BORDER) Zetuvit Plus SILICONE BORDER Dressing 5x5 (in/in) (Generic) 3 x Per Week/30 Days Discharge Instructions: Please do not put silicone bordered dressings under wraps. Use non-bordered dressing only. WOUND #9: - Lower Leg Wound Laterality: Left Cleanser: Byram Ancillary Kit - 15 Day Supply (Generic) 3 x Per Week/30 Days Discharge Instructions: Use supplies as instructed; Kit contains: (15) Saline Bullets; (15) 3x3 Gauze; 15 pr Gloves Cleanser: Soap and Water 3 x Per Week/30  Days Discharge Instructions: Gently cleanse wound with antibacterial soap, rinse  and pat dry prior to dressing wounds Cleanser: Vashe 5.8 (oz) 3 x Per Week/30 Days Discharge Instructions: Use vashe 5.8 (oz) as directed Prim Dressing: Hydrofera Blue Ready Transfer Foam, 2.5x2.5 (in/in) (Generic) 3 x Per Week/30 Days ary Discharge Instructions: Apply Hydrofera Blue Ready to wound bed as directed Secondary Dressing: (BORDER) Zetuvit Plus SILICONE BORDER Dressing 5x5 (in/in) (Generic) 3 x Per Week/30 Days Discharge Instructions: Please do not put silicone bordered dressings under wraps. Use non-bordered dressing only. 1. I would recommend that we have the patient continue to utilize the [facility] Creek I think this is really doing a good job I think the wound is moving in the right direction. 2. I am the recommend as well that we have the patient continue with the bordered foam dressings to cover. 3. I am also can recommend that the patient should continue to utilize the appropriate offloading measures as best he can and I right now we will deal with the wheelchair and hopefully that can be worked out but in the meantime he seems to be compensating pretty well they are doing a good job taking care of him in the interim. We will see patient back for reevaluation in 1 week here in the clinic. If anything worsens or changes patient will contact our office for additional recommendations. Electronic Signature(s) Signed: 08/07/2022 1:34:08 PM By: Allen Derry PA-C Entered By: Allen Derry on 08/07/2022 13:34:08 -------------------------------------------------------------------------------- SuperBill Details Patient Name: Date of Service: [name], [name] [facility] 08/07/2022 Medical Record Number: [id number] Patient Account Number: [id number] Date of Birth/Sex: Treating RN: [birth year] (37 y.o. 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, incomplete B35.4 Tinea corporis L98.8 Other specified disorders of the skin and subcutaneous tissue Facility Procedures : CPT4 Code: [id number] Description: 99213 - WOUND CARE VISIT-LEV 3 EST PT Modifier: Quantity: 1 Physician Procedures : CPT4 Code Description Modifier [id number] 99213 - WC PHYS LEVEL 3 - EST PT ICD-10 Diagnosis Description L89.323 Pressure ulcer of left buttock, 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

## 2022-08-21 ENCOUNTER — Ambulatory Visit: Payer: 59 | Admitting: Physician Assistant

## 2022-09-04 ENCOUNTER — Encounter: Payer: 59 | Admitting: Physician Assistant

## 2022-09-04 DIAGNOSIS — L89323 Pressure ulcer of left buttock, stage 3: Secondary | ICD-10-CM | POA: Diagnosis not present

## 2022-09-25 ENCOUNTER — Ambulatory Visit: Payer: 59 | Admitting: Physician Assistant

## 2022-10-16 ENCOUNTER — Ambulatory Visit: Payer: 59 | Admitting: Physician Assistant

## 2022-11-06 ENCOUNTER — Ambulatory Visit: Payer: 59 | Admitting: Dermatology

## 2022-11-29 IMAGING — DX DG FOOT COMPLETE 3+V*L*
3 series · 3 of 3 positions shown · non-contrast
Comparison: None.

CLINICAL DATA: Left foot injury.  Quadriplegic.

EXAM:
LEFT FOOT - COMPLETE 3+ VIEW

[foot ap]
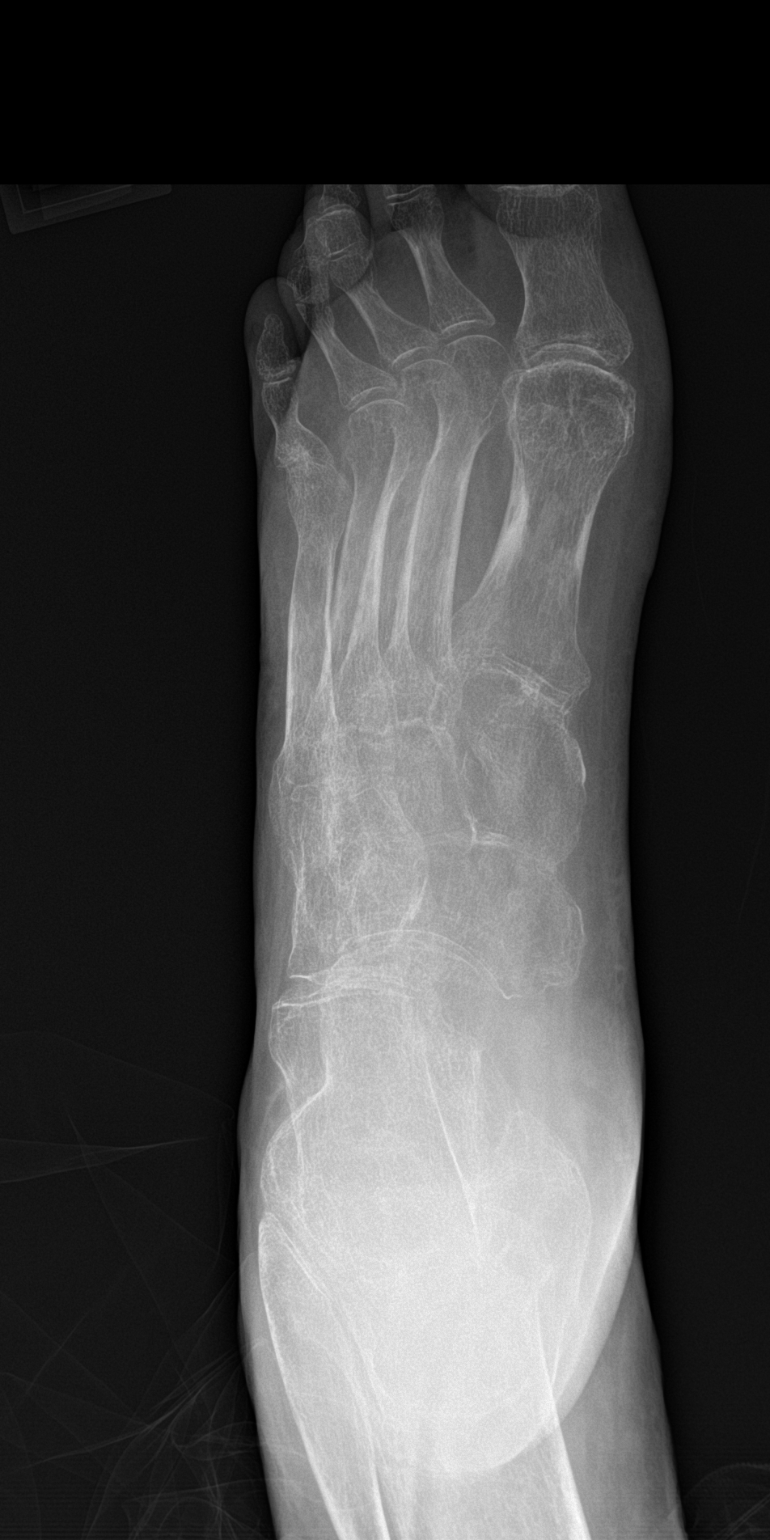

[foot lat (1 of 2)]
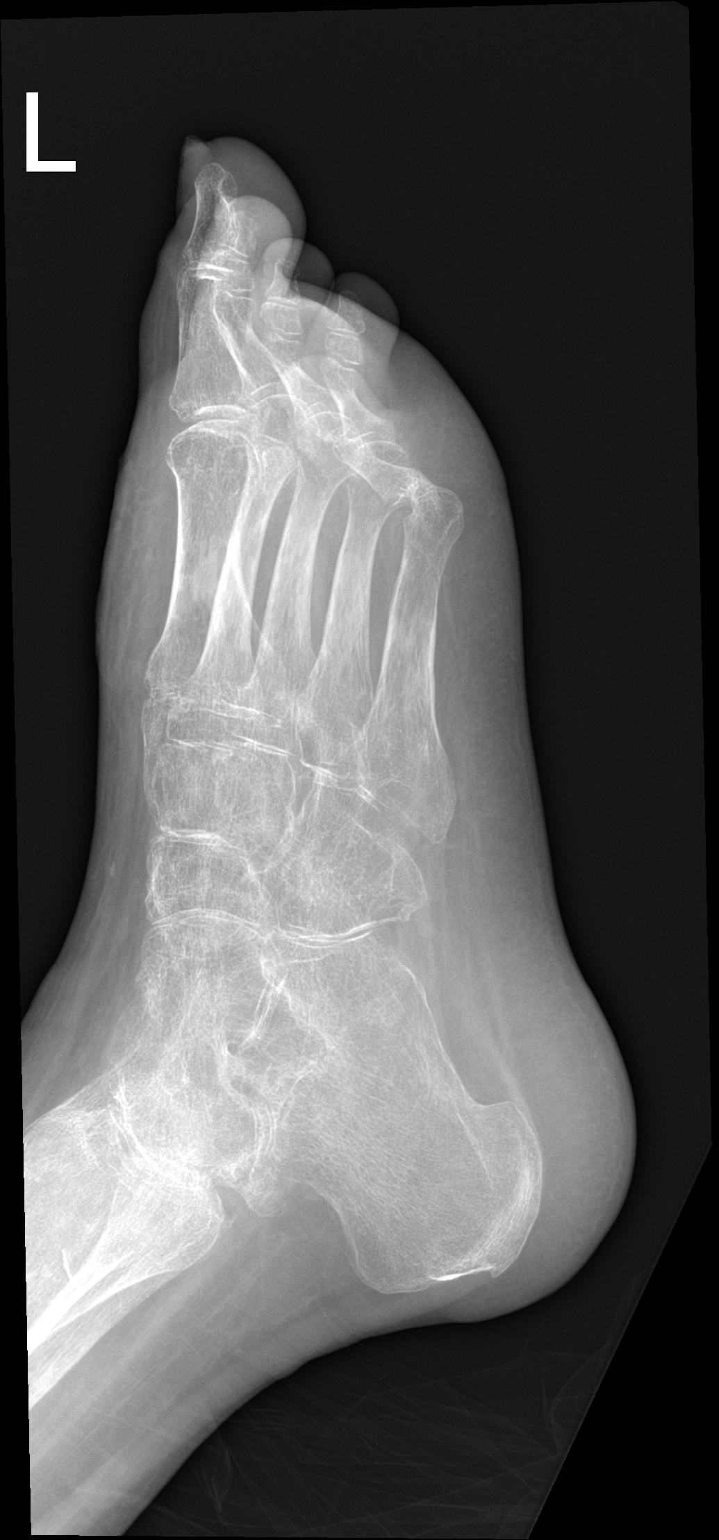

[foot lat (2 of 2)]
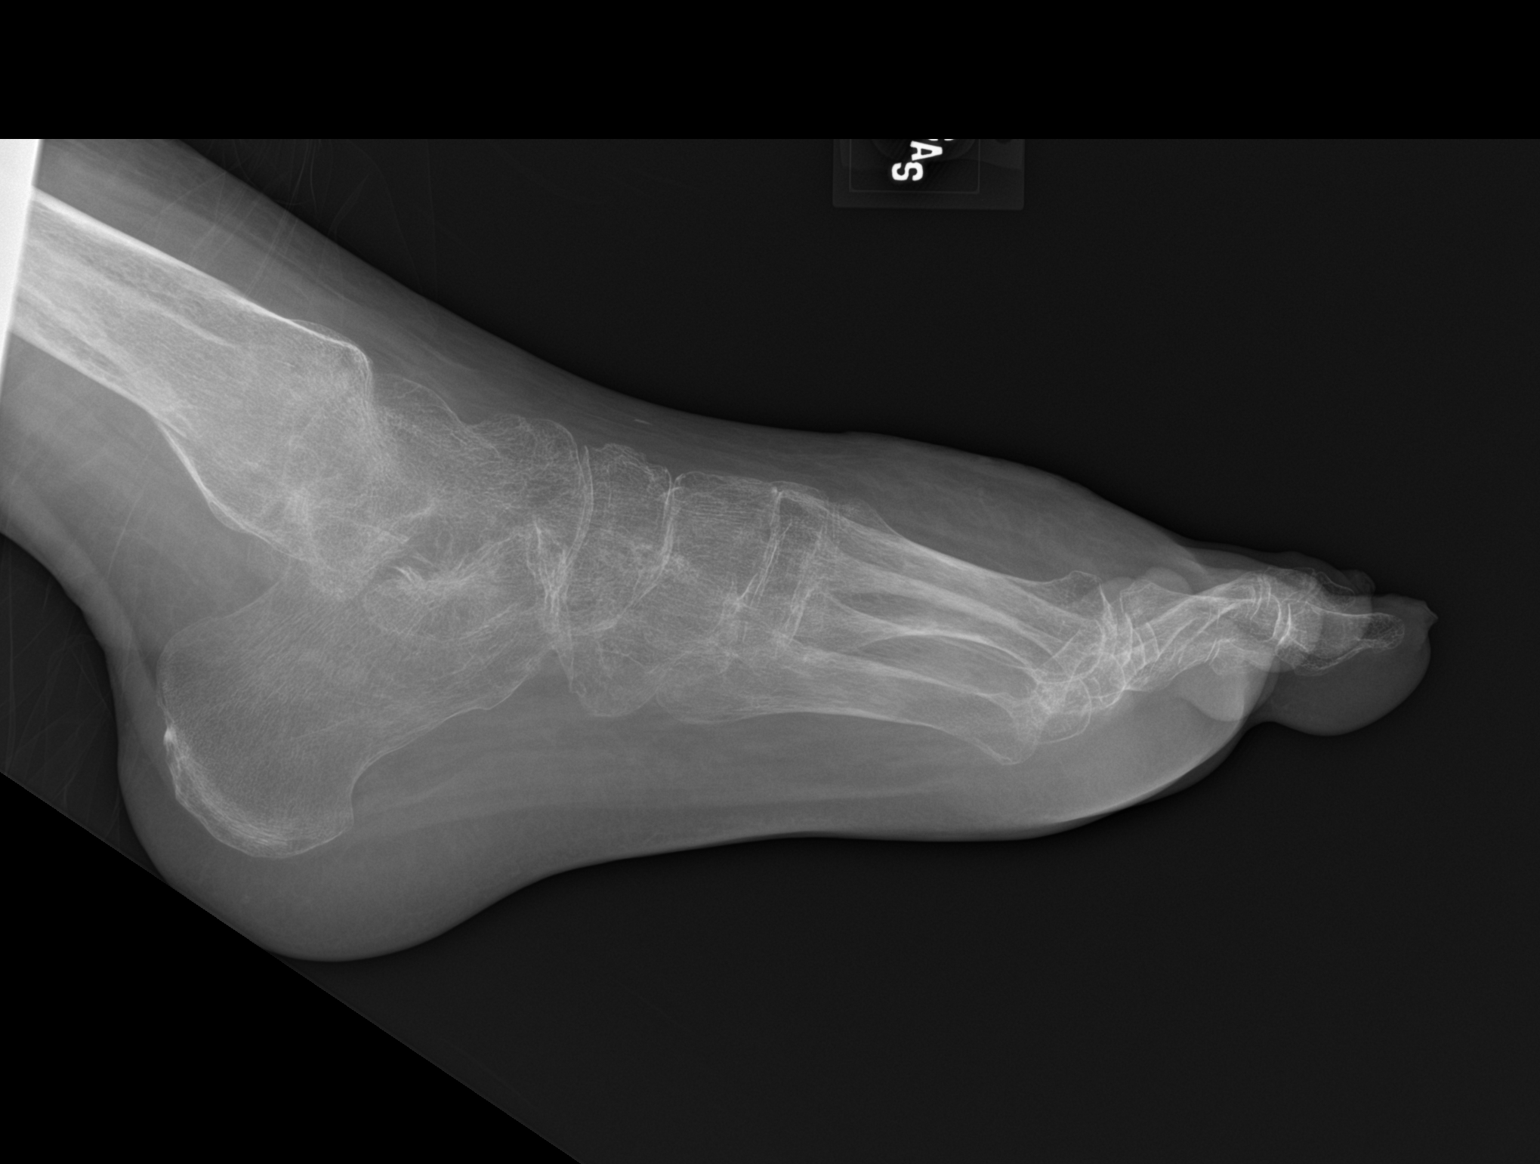

[3 of 3 positions shown; findings below may reference images not displayed]

FINDINGS: No acute fracture or dislocation. Severe disuse osteopenia. Forefoot
soft tissue swelling.
IMPRESSION: 1. Forefoot soft tissue swelling.  No acute osseous abnormality.

## 2022-11-29 IMAGING — DX DG CHEST 1V PORT
1 series · 1 of 1 positions shown · non-contrast
Comparison: None.

CLINICAL DATA: Questionable sepsis - evaluate for abnormality

EXAM:
PORTABLE CHEST 1 VIEW

[chest ap]
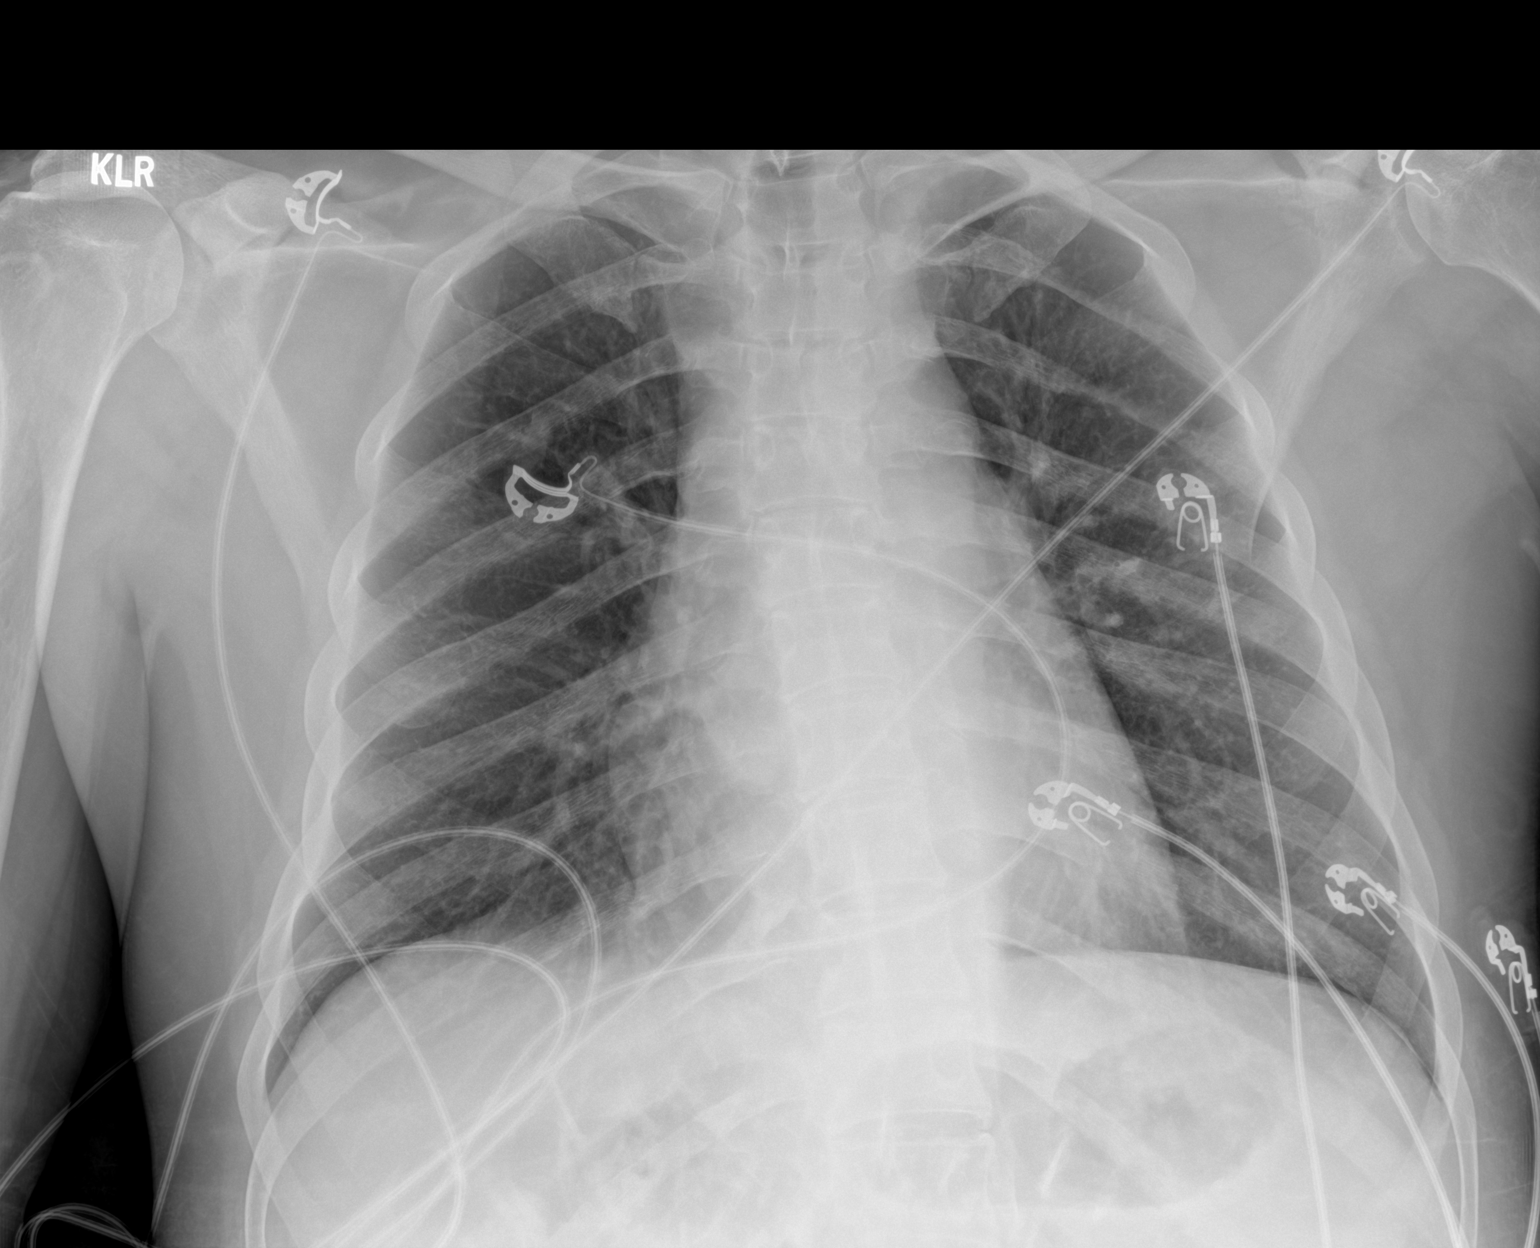

[1 of 1 positions shown; findings below may reference images not displayed]

FINDINGS: The cardiomediastinal silhouette is within normal limits. There is
no focal airspace consolidation. There is no large pleural effusion.
No pneumothorax. There is no acute osseous abnormality.
IMPRESSION: No evidence of acute cardiopulmonary disease.

## 2023-08-27 ENCOUNTER — Ambulatory Visit: Admitting: Physician Assistant

## 2023-09-23 ENCOUNTER — Ambulatory Visit: Admitting: Internal Medicine

## 2023-12-31 ENCOUNTER — Encounter: Attending: Physician Assistant | Admitting: Physician Assistant

## 2023-12-31 DIAGNOSIS — G8222 Paraplegia, incomplete: Secondary | ICD-10-CM | POA: Insufficient documentation

## 2023-12-31 DIAGNOSIS — L89323 Pressure ulcer of left buttock, stage 3: Secondary | ICD-10-CM | POA: Diagnosis not present

## 2023-12-31 DIAGNOSIS — L89313 Pressure ulcer of right buttock, stage 3: Secondary | ICD-10-CM | POA: Insufficient documentation

## 2023-12-31 DIAGNOSIS — L988 Other specified disorders of the skin and subcutaneous tissue: Secondary | ICD-10-CM | POA: Insufficient documentation

## 2024-01-14 ENCOUNTER — Encounter: Attending: Physician Assistant | Admitting: Physician Assistant

## 2024-01-14 DIAGNOSIS — G8222 Paraplegia, incomplete: Secondary | ICD-10-CM | POA: Diagnosis not present

## 2024-01-14 DIAGNOSIS — L89323 Pressure ulcer of left buttock, stage 3: Secondary | ICD-10-CM | POA: Insufficient documentation

## 2024-01-14 DIAGNOSIS — L988 Other specified disorders of the skin and subcutaneous tissue: Secondary | ICD-10-CM | POA: Diagnosis not present

## 2024-01-14 DIAGNOSIS — L89313 Pressure ulcer of right buttock, stage 3: Secondary | ICD-10-CM | POA: Insufficient documentation

## 2024-02-11 ENCOUNTER — Ambulatory Visit: Admitting: Internal Medicine

## 2024-02-18 ENCOUNTER — Encounter: Attending: Internal Medicine | Admitting: Internal Medicine

## 2024-03-10 ENCOUNTER — Encounter: Admitting: Physician Assistant

## 2024-03-18 ENCOUNTER — Encounter: Attending: Internal Medicine | Admitting: Internal Medicine

## 2024-03-18 DIAGNOSIS — G8222 Paraplegia, incomplete: Secondary | ICD-10-CM | POA: Insufficient documentation

## 2024-03-18 DIAGNOSIS — L89313 Pressure ulcer of right buttock, stage 3: Secondary | ICD-10-CM | POA: Insufficient documentation

## 2024-03-18 DIAGNOSIS — L988 Other specified disorders of the skin and subcutaneous tissue: Secondary | ICD-10-CM | POA: Insufficient documentation

## 2024-03-18 DIAGNOSIS — L89323 Pressure ulcer of left buttock, stage 3: Secondary | ICD-10-CM | POA: Diagnosis present

## 2024-04-15 ENCOUNTER — Encounter: Attending: Physician Assistant | Admitting: Physician Assistant

## 2024-04-15 DIAGNOSIS — G8222 Paraplegia, incomplete: Secondary | ICD-10-CM | POA: Insufficient documentation

## 2024-04-15 DIAGNOSIS — L988 Other specified disorders of the skin and subcutaneous tissue: Secondary | ICD-10-CM | POA: Diagnosis not present

## 2024-04-15 DIAGNOSIS — L89313 Pressure ulcer of right buttock, stage 3: Secondary | ICD-10-CM | POA: Diagnosis present

## 2024-04-15 DIAGNOSIS — L89323 Pressure ulcer of left buttock, stage 3: Secondary | ICD-10-CM | POA: Diagnosis not present

## 2024-05-06 ENCOUNTER — Encounter: Admitting: Physician Assistant

## 2024-05-09 ENCOUNTER — Encounter: Admitting: Physician Assistant

## 2024-05-12 ENCOUNTER — Encounter: Admitting: Physician Assistant

## 2024-06-09 ENCOUNTER — Encounter: Admitting: Physician Assistant
